# Patient Record
Sex: Female | Born: 1955 | Race: White | Hispanic: No | Marital: Married | Smoking: Former smoker
Health system: Southern US, Community
[De-identification: ages and names within clinical notes are randomized; demographics above are authoritative.]

## PROBLEM LIST (undated history)

## (undated) DIAGNOSIS — Z79890 Hormone replacement therapy (postmenopausal): Secondary | ICD-10-CM

## (undated) DIAGNOSIS — K589 Irritable bowel syndrome without diarrhea: Secondary | ICD-10-CM

## (undated) DIAGNOSIS — M1990 Unspecified osteoarthritis, unspecified site: Secondary | ICD-10-CM

## (undated) HISTORY — DX: Hormone replacement therapy: Z79.890

## (undated) HISTORY — DX: Irritable bowel syndrome, unspecified: K58.9

## (undated) HISTORY — PX: DILATION AND CURETTAGE OF UTERUS: SHX78

## (undated) HISTORY — PX: TUBAL LIGATION: SHX77

---

## 1999-09-05 ENCOUNTER — Encounter: Payer: Self-pay | Admitting: Neurosurgery

## 1999-09-05 ENCOUNTER — Ambulatory Visit (HOSPITAL_COMMUNITY): Admission: RE | Admit: 1999-09-05 | Discharge: 1999-09-05 | Payer: Self-pay | Admitting: Neurosurgery

## 2002-02-18 ENCOUNTER — Encounter: Payer: Self-pay | Admitting: Family Medicine

## 2002-02-18 ENCOUNTER — Ambulatory Visit (HOSPITAL_COMMUNITY): Admission: RE | Admit: 2002-02-18 | Discharge: 2002-02-18 | Payer: Self-pay | Admitting: Family Medicine

## 2004-03-05 ENCOUNTER — Ambulatory Visit (HOSPITAL_COMMUNITY): Admission: RE | Admit: 2004-03-05 | Discharge: 2004-03-05 | Payer: Self-pay | Admitting: Family Medicine

## 2006-12-18 ENCOUNTER — Ambulatory Visit: Payer: Self-pay | Admitting: Urgent Care

## 2007-02-13 ENCOUNTER — Ambulatory Visit: Payer: Self-pay | Admitting: Internal Medicine

## 2007-02-13 ENCOUNTER — Encounter: Payer: Self-pay | Admitting: Internal Medicine

## 2007-02-13 ENCOUNTER — Ambulatory Visit (HOSPITAL_COMMUNITY): Admission: RE | Admit: 2007-02-13 | Discharge: 2007-02-13 | Payer: Self-pay | Admitting: Internal Medicine

## 2007-03-17 ENCOUNTER — Ambulatory Visit: Payer: Self-pay | Admitting: Internal Medicine

## 2008-03-07 ENCOUNTER — Ambulatory Visit (HOSPITAL_COMMUNITY): Admission: RE | Admit: 2008-03-07 | Discharge: 2008-03-07 | Payer: Self-pay | Admitting: Family Medicine

## 2009-07-24 ENCOUNTER — Ambulatory Visit (HOSPITAL_COMMUNITY): Admission: RE | Admit: 2009-07-24 | Discharge: 2009-07-24 | Payer: Self-pay | Admitting: Family Medicine

## 2010-04-30 ENCOUNTER — Encounter: Payer: Self-pay | Admitting: Family Medicine

## 2010-08-21 NOTE — Consult Note (Signed)
NAMEHILARI, Jennifer Anderson                 ACCOUNT NO.:  [id number]   MEDICAL RECORD NO.:  [id number]          PATIENT TYPE:  AMB   LOCATION:  DAY                           FACILITY:  [facility]   PHYSICIAN:  R. Roetta Sessions, M.D. DATE OF BIRTH:  July 22, 1955   DATE OF CONSULTATION:  DATE OF DISCHARGE:                                 CONSULTATION   REASON FOR CONSULTATION:  Chronic diarrhea.   HISTORY OF PRESENT ILLNESS:  [name] is a 55 year old Caucasian  female.  She reports three months ago she began to have constipation and  bloating.  She took a laxative. She says she felt like she had a  stomach virus, she was up all night with cramps and loose stools, this  continued throughout the day and for several days. She began to pass  tissue like material in her stools.  She denies any rectal bleeding or  melena.  She says her abdominal pain has decreased in frequency, but she  is still having significant urgency.  She is still having four to five  loose mucous stools per day.  She describes them as the consistency of  pudding.  She previously had normal soft brown bowel movements.  She did  have loose stools with her periods when she was younger, but since  menopause, she has had what she felt to be regular bowel movements.  She  denies any foreign travel.  She was recently treated with antibiotics  for a pharyngitis. She tells me she had low grade fever when the  diarrhea first started three months ago. She denies any ill contacts.  She denies any NSAID use or other over-the-counter medications, or new  medications.  Her weight has been stable.  She does have horses and a  new puppy at home.   PAST MEDICAL AND SURGICAL HISTORY:  Frequent sinus infections, C-  section, tubal ligation.   CURRENT MEDICATIONS:  1. Prempro once daily.  2. Calcium 600 mg with vitamin D daily.   ALLERGIES:  NO KNOWN DRUG ALLERGIES.   FAMILY HISTORY:  She has a cousin with Crohn's, but no first degree  relatives  with any colorectal carcinoma, liver or chronic GI problems  including inflammatory bowel disease.  Mother deceased at 72 secondary  MI.  Father at age 80 has history of polio and MI.  She has three  brothers with history significant for hypertension.   SOCIAL HISTORY:  [name] is married.  She has two grown healthy  daughters.  She is employed with [employer].  She has a remote  history of smoking for about 20 years, less than a pack a day.  Denies  any alcohol or drug use.   REVIEW OF SYSTEMS:  See HPI.  GU:  She does have urgency but denies any  increased frequency.  GI:  Denies any heartburn, indigestion, dysphagia,  odynophagia, anorexia or early satiety.   PHYSICAL EXAMINATION:  VITAL SIGNS:  Weight 130 pounds, height 67  inches, temp 97.9, blood pressure 120/82, pulse 64.  GENERAL:  [name] is a well-developed, well-nourished  Caucasian female  in no acute distress.  HEENT.  Pupils are clear, anicteric;  conjunctivae pink.  Oropharynx  pink and moist without lesions.  NECK:  Supple without masses or thyromegaly.  CHEST:  Heart regular rate and rhythm.  Normal S1-S2. No murmurs,  clicks, rubs or gallops.  LUNGS:  Lungs clear to auscultation bilaterally.  ABDOMEN:  Positive bowel sounds x4.  No bruits auscultated.  Soft,  nontender and without palpable mass or visceromegaly.  No rebound  tenderness or guarding.  EXTREMITIES:  Without clubbing or edema bilaterally.  SKIN:  Pink, warm, dry without any rash or jaundice.   LABORATORY DATA:  Laboratory studies;  she had a negative C.  Diff and  Giardia Cryptosporidium and stool culture on November 19, 2006.   IMPRESSION:  [name] is a 55 year old Caucasian female with a three  month history of diarrhea, this is a change from her baseline.  She is  also having lower abdominal cramping which is resolved post defecation.  She has also had a change in consistency, although her stools, which are  pudding  like with mucous, but  nonbloody.  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  Given her age, we also need to  rule out colorectal carcinoma.   PLAN:  1. Colonoscopy with possible biopsies for microscopic colitis if      needed by Dr. Jena Gauss in the near future.  I discussed the procedure      including the risks, benefits, including but not limited to      bleeding, infection, perforation, drug reaction.  She agrees with      plan and consent will be obtained.  2. CBC, sed rate, TSH and CMP.  3. Align once daily, I have given her two weeks' worth of samples. She      is instructed to eat yogurt with meals if possible.  4. Bentyl 10 mg p.o. a.c. and h.s. #60 with one refill.   We would like to thank Dr. Gerda Diss for asking to participate in the care  of [name].      Lorenza Burton, N.P.      Jonathon Bellows, M.D.  Electronically Signed    KJ/MEDQ  D:  12/18/2006  T:  12/19/2006  Job:  [id number]

## 2010-08-21 NOTE — Op Note (Signed)
NAMECLARIECE, Jennifer Anderson                 ACCOUNT NO.:  [id number]   MEDICAL RECORD NO.:  [id number]          PATIENT TYPE:  AMB   LOCATION:  DAY                           FACILITY:  [facility]   PHYSICIAN:  R. Roetta Sessions, M.D. DATE OF BIRTH:  11-Apr-1955   DATE OF PROCEDURE:  02/13/2007  DATE OF DISCHARGE:                               OPERATIVE REPORT   PROCEDURE:  Diagnostic ileocolonoscopy with biopsy and stool sampling.   INDICATIONS FOR PROCEDURE:  A 55 year old lady with several-month  history of nonbloody watery diarrhea which began rather acutely.  Stool  studies have been negative.  Colonoscopy is being done to rule out  microscopic colitis and in part for colorectal cancer screening.  She  has never had lower GI tract imaging and no family history of colon  cancer or inflammatory bowel disease.  We saw her back in September,  however, this lady has put off colonoscopy until now.  Please see  documentation in medical records.   DESCRIPTION OF PROCEDURE:  O2 saturation, blood pressure, and pulse on  this patient were monitored throughout the entire procedure.  Conscious  sedation with Versed 6 mg IV, Demerol 125 mg IV in divided doses.  Instrument was Pentax video chip system.   FINDINGS:  Digital rectal examination revealed no abnormalities.   ENDOSCOPIC ASSESSMENT:  Prep was excellent.   Colon:  Colonic mucosa was surveyed from the rectosigmoid junction  through the left transverse and right colon to the appendiceal orifice,  ileocecal valve, and cecum.  These structures were well seen and  photographed from the rectum.  The terminal ileum was intubated 10 cm.  From this level the scope was slowly withdrawn.  All previously  mentioned mucosal surfaces were again seen.  The colonic mucosa was well  seen and appeared entirely normal as did the terminal ileum mucosa.  Segmental biopsies of the transverse, descending, sigmoid, and rectal  mucosa were taken for histologic study.   Also stool residue was  suctioned out for repeat microbiology studies.  The scope was pulled  down to the rectum where the rectal mucosa was examined.  The rectal  vault was very small.  I attempted to retroflex, but was unable to do  so, but for the same reason I was able to see the rectal mucosa very  well and it appeared normal.  Biopsies were taken.  The patient  tolerated the procedure well and was reactivated in endoscopy.   IMPRESSION:  Normal rectum, colon, and terminal ileum, status post  segmental biopsies and stool collection.   RECOMMENDATIONS:  Biopsies will help [place] rule out microscopic colitis.  There is no evidence of inflammatory bowel disease on this examination.  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  We will make further recommendations once  the study results are available for review.      Jonathon Bellows, M.D.  Electronically Signed     RMR/MEDQ  D:  02/13/2007  T:  02/13/2007  Job:  [id number]   cc:   Lorin Picket A. Luking,  MD  Fax: [phone]

## 2010-08-21 NOTE — Assessment & Plan Note (Signed)
NAMEAYNSLEE, MULHALL                  CHART#:  [id number]   DATE:  03/17/2007                       DOB:  [birth year]   CHIEF COMPLAINT:  A 6 month history of diarrhea, followup colonoscopy.   SUBJECTIVE:  The patient is a 55 year old female who developed diarrhea  after a gastroenteritis-type illness back 6 months ago.  She had  symptoms with postprandial diarrhea and significant urgency.  She  underwent diagnostic ileo-colonoscopy with biopsy and stool sampling by  Dr. Jena Gauss on 02/13/2007.  She was found to have a normal rectal colon  and terminal ileum, and multiple random biopsies showed benign mucosa.  She had tried Bentyl, which did seem to help, but does cause some  blurred vision.  She denies any history of glaucoma.  She has continued  to have dark, pudding-like stools 5 to 7 times per day.  Denies any  rectal bleeding.  She has noted some mucus in her stools.  Her weight is  up 3 pounds.  She does have some transient nausea.  Denies any  significant abdominal pain or vomiting.  Denies any fevers or chills.   CURRENT MEDICATIONS:  See the list from 03/17/2007.   ALLERGIES:  No known drug allergies.   OBJECTIVE:  VITAL SIGNS:  Weight 153 pounds, height 67 inches,  temperature 98.4, blood pressure 104/70, and pulse 72.  GENERAL:  The patient is a well-developed, well-nourished Caucasian  female in no acute distress.  who is awake and alert, oriented, pleasant, and cooperative, in no acute  distress.  HEENT:  Pupils equal, round, and reactive to light.  Sclerae clear,  nonicteric.  Conjunctivae pink.  Oropharynx pink and moist without  lesions.  NECK:  Supple without evidence of mass or thyromegaly.  CHEST:  Heart regular rate and rhythm.  Normal S1, S2 without murmurs,  clicks, rubs, or gallops.  LUNGS:  Clear to auscultation bilaterally.  ABDOMEN:  Positive bowel sounds x4.  No bruits auscultated.  Soft.  Nontender, nondistended without palpable mass or hepatosplenomegaly.   No  rebound tenderness or guarding.  EXTREMITIES:  Without clubbing or edema bilaterally.   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   PLAN:  1. Irritable bowel syndrome literature given for her review.  2. Benefiber samples given to begin once daily or fiber supplement of      choice.  3. Symax 0.125 mg AC and at bedtime #60 with 2 refills.  4. Discontinue Bentyl.  5. Begin Align once daily.  6. Office visit in 3 months with Dr. Jena Gauss or as needed.       Lorenza Burton, N.P.  Electronically Signed     R. Roetta Sessions, M.D.  Electronically Signed    KJ/MEDQ  D:  03/17/2007  T:  03/17/2007  Job:  [id number]   cc:   Gwyneth Sprout, NP  Lorin Picket A. Gerda Diss, MD

## 2010-08-24 NOTE — Procedures (Signed)
   Jennifer Anderson, Jennifer Anderson                           ACCOUNT NO.:  [id number]   MEDICAL RECORD NO.:  [id number]                   PATIENT TYPE:  OUT   LOCATION:  [facility]                                 FACILITY:  [facility]   PHYSICIAN:  Edward L. Juanetta Gosling, M.D.             DATE OF BIRTH:  01-28-1956   DATE OF PROCEDURE:  DATE OF DISCHARGE:  02/18/2002                              PULMONARY FUNCTION TEST   PROBLEMS:  Spirometry shows no definitive ventilatory defect but evidence of  air flow obstruction.  The computer has suggested extrathoracic flow  obstruction or tracheal stenosis.  The loop is really not all that typical  of those problems.  If such difficulty is clinically suspected, CT scan of  this area may be of some help.                                               Edward L. Juanetta Gosling, M.D.    ELH/MEDQ  D:  02/20/2002  T:  02/20/2002  Job:  [id number]

## 2011-01-15 LAB — STOOL CULTURE

## 2011-01-15 LAB — FECAL LACTOFERRIN, QUANT: Fecal Lactoferrin: NEGATIVE

## 2011-01-15 LAB — CLOSTRIDIUM DIFFICILE EIA: C difficile Toxins A+B, EIA: NEGATIVE

## 2011-01-15 LAB — OVA AND PARASITE EXAMINATION

## 2012-08-24 ENCOUNTER — Other Ambulatory Visit: Payer: Self-pay | Admitting: Nurse Practitioner

## 2012-08-24 ENCOUNTER — Telehealth: Payer: Self-pay | Admitting: Nurse Practitioner

## 2012-08-24 MED ORDER — CONJ ESTROG-MEDROXYPROGEST ACE 0.625-5 MG PO TABS: 1.0000 | ORAL_TABLET | Freq: Every day | ORAL | Status: DC

## 2012-08-24 NOTE — Telephone Encounter (Signed)
Rx called in. Patient needs PE; last one in 2012; especially with hormones. Sent in 3 months total of hormones to give her time to get in for physical.

## 2012-08-24 NOTE — Telephone Encounter (Signed)
Patient needs a refill of prempro to [facility] in [place]

## 2012-08-24 NOTE — Telephone Encounter (Signed)
Last physical 01/02/11, seen in office 04/09/12 for sick visit

## 2012-08-24 NOTE — Telephone Encounter (Signed)
Patient notified that RX was called in and to schedule PE. Patient verbalized understanding.

## 2012-09-02 ENCOUNTER — Encounter: Payer: Self-pay | Admitting: Nurse Practitioner

## 2012-09-02 ENCOUNTER — Ambulatory Visit (INDEPENDENT_AMBULATORY_CARE_PROVIDER_SITE_OTHER): Payer: BC Managed Care – PPO | Admitting: Nurse Practitioner

## 2012-09-02 VITALS — BP 118/68 | HR 70 | Wt 137.0 lb

## 2012-09-02 DIAGNOSIS — L290 Pruritus ani: Secondary | ICD-10-CM

## 2012-09-02 DIAGNOSIS — R5381 Other malaise: Secondary | ICD-10-CM

## 2012-09-02 DIAGNOSIS — Z0000 Encounter for general adult medical examination without abnormal findings: Secondary | ICD-10-CM

## 2012-09-02 DIAGNOSIS — Z01419 Encounter for gynecological examination (general) (routine) without abnormal findings: Secondary | ICD-10-CM

## 2012-09-02 DIAGNOSIS — R5383 Other fatigue: Secondary | ICD-10-CM

## 2012-09-02 DIAGNOSIS — Z13220 Encounter for screening for lipoid disorders: Secondary | ICD-10-CM

## 2012-09-02 MED ORDER — PREMPRO 0.625-5 MG PO TABS: 1.0000 | ORAL_TABLET | Freq: Every day | ORAL | Status: DC

## 2012-09-02 MED ORDER — CLOTRIMAZOLE-BETAMETHASONE 1-0.05 % EX CREA: TOPICAL_CREAM | Freq: Two times a day (BID) | CUTANEOUS | Status: DC

## 2012-09-03 ENCOUNTER — Encounter: Payer: Self-pay | Admitting: Nurse Practitioner

## 2012-09-03 NOTE — Progress Notes (Signed)
Subjective:    Patient ID: Jennifer Anderson, female    DOB: Aug 10, 1955, 57 y.o.   MRN: [id number]  HPI presents for wellness checkup. Married, same sexual partner. No vaginal bleeding her menses. No pelvic pain. Takes daily calcium and vitamin D. Gets regular eye exams. Regular dental exams. Had a recent mammogram at [facility] which she states was normal. Mild fatigue. Has a history of chronic external anal itching. Has tried multiple therapies including Vermox with no improvement. No hemorrhoids. Just intense rectal itching at times.    Review of Systems  Constitutional: Negative for activity change, appetite change and fatigue.  HENT: Negative for dental problem.   Eyes: Negative for visual disturbance.  Respiratory: Negative for chest tightness and shortness of breath.   Cardiovascular: Negative for chest pain.  Gastrointestinal: Negative for abdominal pain, diarrhea, constipation, blood in stool, abdominal distention, anal bleeding and rectal pain.  Genitourinary: Negative for urgency, vaginal bleeding, vaginal discharge, difficulty urinating, menstrual problem and pelvic pain.       Objective:   Physical Exam  Constitutional: She is oriented to person, place, and time. She appears well-developed. No distress.  HENT:  Right Ear: External ear normal.  Left Ear: External ear normal.  Mouth/Throat: Oropharynx is clear and moist.  Neck: Normal range of motion. Neck supple. No tracheal deviation present. No thyromegaly present.  Cardiovascular: Normal rate, regular rhythm and normal heart sounds.  Exam reveals no gallop.   No murmur heard. Pulmonary/Chest: Effort normal and breath sounds normal.  Abdominal: Soft. She exhibits no distension. There is no tenderness.  Genitourinary: Vagina normal and uterus normal. No vaginal discharge found.  Musculoskeletal: She exhibits no edema.  Lymphadenopathy:    She has no cervical adenopathy.  Neurological: She is alert and oriented to  person, place, and time.  Skin: Skin is warm and dry. No rash noted.  Psychiatric: She has a normal mood and affect. Her behavior is normal.   Breast exam: Mild fine nodularity, no dominant masses. Exam no adenopathy. External GU normal limit. Bimanual exam uterus and adnexa normal limit, no obvious masses. No tenderness. Rectal area no rash noted. One small skin tag from previous hemorrhoids noted, nontender. Rectal exam normal limit, no stool for Hemoccult. Skin in general particularly on sun exposed areas areas moderate sun damage.       Assessment & Plan:  Well woman exam  Rectal itching  Fatigue - Plan: Basic metabolic panel, Hepatic function panel, TSH, Vitamin D 25 hydroxy, Basic metabolic panel, Hepatic function panel, TSH, Vitamin D 25 hydroxy  Screening, lipid - Plan: Lipid panel, Lipid panel  Given information for Dr. Orvan Falconer in [place], recommend skin cancer screening. Meds ordered this encounter  Medications  . OVER THE COUNTER MEDICATION    Sig: Calcium with Vit D one every day  . PREMPRO 0.625-5 MG per tablet    Sig: Take 1 tablet by mouth daily.    Dispense:  30 tablet    Refill:  11    Please dispense name brand Prempro    Order Specific Question:  Supervising Provider    Answer:  Merlyn Albert [[id number]]  . clotrimazole-betamethasone (LOTRISONE) cream    Sig: Apply topically 2 (two) times daily. Use up to 2 weeks.  Call back if symptoms persist.    Dispense:  30 g    Refill:  0    Order Specific Question:  Supervising Provider    Answer:  Riccardo Dubin   Patient wishes  to continue hormone therapy. Discussed at length risks factors including cancer and blood clots. Has tried to wean off in the past, has extreme symptoms. No relief with natural supplements. Trial of Lotrisone cream to the rectal area, call back if no improvement. Possibly related to nerve endings. Next physical in one year.

## 2012-10-01 ENCOUNTER — Encounter: Payer: Self-pay | Admitting: Nurse Practitioner

## 2012-10-08 ENCOUNTER — Telehealth: Payer: Self-pay | Admitting: Family Medicine

## 2012-10-08 ENCOUNTER — Encounter: Payer: Self-pay | Admitting: Nurse Practitioner

## 2012-10-08 LAB — HEPATIC FUNCTION PANEL
AST: 15 U/L (ref 0–37)
Albumin: 4.2 g/dL (ref 3.5–5.2)
Alkaline Phosphatase: 63 U/L (ref 39–117)
Indirect Bilirubin: 0.4 mg/dL (ref 0.0–0.9)
Total Bilirubin: 0.6 mg/dL (ref 0.3–1.2)

## 2012-10-08 LAB — BASIC METABOLIC PANEL
Calcium: 9.1 mg/dL (ref 8.4–10.5)
Glucose, Bld: 78 mg/dL (ref 70–99)
Potassium: 4.6 mEq/L (ref 3.5–5.3)
Sodium: 144 mEq/L (ref 135–145)

## 2012-10-08 LAB — LIPID PANEL
HDL: 65 mg/dL (ref >39)
LDL Cholesterol: 81 mg/dL (ref 0–99)
Total CHOL/HDL Ratio: 2.4 Ratio
VLDL: 10 mg/dL (ref 0–40)

## 2012-10-08 NOTE — Telephone Encounter (Signed)
Enc Date 10/08/12 - printed & mailed 10/12/12

## 2012-10-12 ENCOUNTER — Encounter: Payer: Self-pay | Admitting: Nurse Practitioner

## 2012-11-10 ENCOUNTER — Telehealth: Payer: Self-pay | Admitting: Family Medicine

## 2012-11-10 NOTE — Telephone Encounter (Signed)
If is a small localized rash, just HC 1% cream for itching.  Call if unexplained fever, headache, other rashes including bulls-eye rash; it may take a few weeks for a localized reaction to the bite to resolve.

## 2012-11-10 NOTE — Telephone Encounter (Signed)
Left message to return call 

## 2012-11-10 NOTE — Telephone Encounter (Signed)
No other sx

## 2012-11-10 NOTE — Telephone Encounter (Signed)
Discussed with patient. Patient verbalized understanding. 

## 2012-11-10 NOTE — Telephone Encounter (Signed)
Patient got a tick bite day before yesterday, but now has a rash around it. She would like to know if she should take some precautionary measures.

## 2013-02-11 ENCOUNTER — Other Ambulatory Visit: Payer: Self-pay

## 2013-02-28 ENCOUNTER — Encounter: Payer: Self-pay | Admitting: Family Medicine

## 2013-09-02 ENCOUNTER — Other Ambulatory Visit: Payer: Self-pay | Admitting: Nurse Practitioner

## 2013-10-11 ENCOUNTER — Other Ambulatory Visit: Payer: Self-pay | Admitting: Family Medicine

## 2013-10-27 ENCOUNTER — Ambulatory Visit (INDEPENDENT_AMBULATORY_CARE_PROVIDER_SITE_OTHER): Payer: PRIVATE HEALTH INSURANCE | Admitting: Nurse Practitioner

## 2013-10-27 ENCOUNTER — Encounter: Payer: Self-pay | Admitting: Nurse Practitioner

## 2013-10-27 VITALS — BP 116/76 | Resp 18 | Ht 67.0 in | Wt 132.0 lb

## 2013-10-27 DIAGNOSIS — M25551 Pain in right hip: Secondary | ICD-10-CM

## 2013-10-27 DIAGNOSIS — M25559 Pain in unspecified hip: Secondary | ICD-10-CM

## 2013-10-27 DIAGNOSIS — R252 Cramp and spasm: Secondary | ICD-10-CM

## 2013-10-27 MED ORDER — PREMPRO 0.625-5 MG PO TABS: ORAL_TABLET | ORAL | Status: DC

## 2013-10-31 ENCOUNTER — Encounter: Payer: Self-pay | Admitting: Nurse Practitioner

## 2013-10-31 NOTE — Progress Notes (Signed)
   Subjective:    Patient ID: Jennifer Anderson, female    DOB: 05/22/1955, 58 y.o.   MRN: [id number]  HPI  presents for complaints of right hip pain off-and-on for the past 2 years. Will sometimes wake her up at nighttime. No history of injury. Rides horses frequently. No back pain. Also localized cramping in the left lateral lower leg at times. History of significant injury to her left ankle in the past.    Review of Systems  Musculoskeletal:       Right hip pain   cramping left lateral leg.     Objective:   Physical Exam   NAD. Alert, oriented. No lumbar spinal or paraspinal tenderness. Good ROM of the right hip with minimal tenderness. Minimal tenderness with palpation of the right hip joint. Left lower leg no edema. Mild supination of the left foot with standing and gait. Also mild changes in the left ankle from previous injury.        Assessment & Plan:  Right hip pain  Muscle cramps secondary to change in gait related to previous ankle injury  Neoprene ankle support. Patient given form for x-ray of the right hip along with her screening mammogram. Recommend scheduling a wellness physical within the next few months. Meds ordered this encounter  Medications  . PREMPRO 0.625-5 MG per tablet    Sig: take 1 tablet by mouth once daily    Dispense:  30 tablet    Refill:  5    Order Specific Question:  Supervising Provider    Answer:  Mikey Kirschner [[id number]]   Discussed risks associated with continued HRT use including cancer risk and blood clots. Patient understands risk but wishes to continue at this time.

## 2013-11-16 ENCOUNTER — Telehealth: Payer: Self-pay | Admitting: Family Medicine

## 2013-11-16 DIAGNOSIS — M25551 Pain in right hip: Secondary | ICD-10-CM

## 2013-11-16 NOTE — Telephone Encounter (Signed)
Patient stated she went to [facility] and had this done. Faxed over a request to [place] records for results of mammo and xray.

## 2013-11-16 NOTE — Telephone Encounter (Signed)
Patient calling to get results to the xray of her hip.

## 2013-11-16 NOTE — Telephone Encounter (Signed)
Patient was given a form for mammogram and xray of right hip. I think she was going to [place] or somewhere outside of [place].

## 2013-11-16 NOTE — Telephone Encounter (Signed)
Pt see on 7/22. I do not see where an xray was ordered?

## 2013-11-17 NOTE — Telephone Encounter (Signed)
Results put on chart in Carolyn's basket.

## 2013-11-18 NOTE — Telephone Encounter (Signed)
See notes on forms.

## 2013-11-18 NOTE — Telephone Encounter (Signed)
Pt notified and verbalized understanding of xray results. I put in referral for ortho d/t persistent pain.

## 2013-12-07 ENCOUNTER — Telehealth: Payer: Self-pay | Admitting: Family Medicine

## 2013-12-07 NOTE — Telephone Encounter (Signed)
Checking on referral for hip pain.

## 2013-12-09 ENCOUNTER — Encounter: Payer: Self-pay | Admitting: Nurse Practitioner

## 2013-12-09 NOTE — Telephone Encounter (Signed)
Pt states [place] office of [facility] is fine, referral sent, await appt info

## 2013-12-09 NOTE — Telephone Encounter (Signed)
[facility] - ? Preference on ortho

## 2013-12-14 ENCOUNTER — Encounter: Payer: Self-pay | Admitting: Family Medicine

## 2014-02-14 ENCOUNTER — Ambulatory Visit (INDEPENDENT_AMBULATORY_CARE_PROVIDER_SITE_OTHER): Payer: PRIVATE HEALTH INSURANCE | Admitting: Nurse Practitioner

## 2014-02-14 ENCOUNTER — Encounter: Payer: Self-pay | Admitting: Nurse Practitioner

## 2014-02-14 VITALS — BP 132/80 | HR 80 | Ht 66.25 in | Wt 133.4 lb

## 2014-02-14 DIAGNOSIS — Z01419 Encounter for gynecological examination (general) (routine) without abnormal findings: Secondary | ICD-10-CM

## 2014-02-14 DIAGNOSIS — Z124 Encounter for screening for malignant neoplasm of cervix: Secondary | ICD-10-CM

## 2014-02-14 DIAGNOSIS — Z1151 Encounter for screening for human papillomavirus (HPV): Secondary | ICD-10-CM

## 2014-02-14 DIAGNOSIS — R5383 Other fatigue: Secondary | ICD-10-CM

## 2014-02-14 DIAGNOSIS — Z139 Encounter for screening, unspecified: Secondary | ICD-10-CM

## 2014-02-14 DIAGNOSIS — Z0000 Encounter for general adult medical examination without abnormal findings: Secondary | ICD-10-CM

## 2014-02-14 MED ORDER — PREMPRO 0.625-5 MG PO TABS: ORAL_TABLET | ORAL | Status: DC

## 2014-02-16 LAB — PAP IG W/ RFLX HPV ASCU

## 2014-02-17 ENCOUNTER — Encounter: Payer: Self-pay | Admitting: Nurse Practitioner

## 2014-02-17 LAB — HUMAN PAPILLOMAVIRUS, HIGH RISK: HPV DNA High Risk: NOT DETECTED

## 2014-02-17 NOTE — Progress Notes (Signed)
   Subjective:    Patient ID: Jennifer Anderson, female    DOB: 1955-05-08, 58 y.o.   MRN: [id number]  HPI presents for her wellness exam. Married, same sexual partner. No vaginal bleeding. Very active lifestyle. Eats healthy. Regular vision and dental exams. Unsure about varicella status. Note her hip pain is much improved.    Review of Systems  Constitutional: Negative for fever, activity change, appetite change and fatigue.  HENT: Negative for dental problem, sinus pressure and sore throat.   Respiratory: Negative for cough, chest tightness, shortness of breath and wheezing.   Cardiovascular: Negative for chest pain.  Gastrointestinal: Negative for nausea, vomiting, abdominal pain, diarrhea, constipation, blood in stool and abdominal distention.  Genitourinary: Negative for dysuria, urgency, frequency, vaginal bleeding, vaginal discharge, enuresis, difficulty urinating, genital sores and pelvic pain.       Objective:   Physical Exam  Constitutional: She is oriented to person, place, and time. She appears well-developed. No distress.  HENT:  Right Ear: External ear normal.  Left Ear: External ear normal.  Mouth/Throat: Oropharynx is clear and moist.  Neck: Normal range of motion. Neck supple. No tracheal deviation present. No thyromegaly present.  Cardiovascular: Normal rate, regular rhythm and normal heart sounds.  Exam reveals no gallop.   No murmur heard. Pulmonary/Chest: Effort normal and breath sounds normal.  Abdominal: Soft. She exhibits no distension. There is no tenderness.  Genitourinary: Vagina normal and uterus normal. No vaginal discharge found.  External GU no rashes or lesions noted. Vagina slightly pale, no discharge. Cervix normal limit in appearance, no CMT. Bimanual exam no tenderness or masses; ovaries nonpalpable. Rectal exam no masses, no stool for Hemoccult.  Musculoskeletal: She exhibits no edema.  Lymphadenopathy:    She has no cervical adenopathy.    Neurological: She is alert and oriented to person, place, and time.  Skin: Skin is warm and dry. No rash noted.  Psychiatric: She has a normal mood and affect. Her behavior is normal.  Vitals reviewed. Breast exam: Slightly dense tissue, no masses. Axillae no adenopathy.        Assessment & Plan:  Well woman exam  Screening for cervical cancer - Plan: Pap IG w/ reflex to HPV when ASC-U  Screening - Plan: POC Hemoccult Bld/Stl (3-Cd Home Screen), Lipid panel, Hepatic function panel, Basic metabolic panel, Varicella zoster antibody, IgG, CANCELED: Lipid panel, CANCELED: Hepatic function panel, CANCELED: Basic metabolic panel  Other fatigue - Plan: Hepatic function panel, Basic metabolic panel, TSH, CANCELED: Hepatic function panel, CANCELED: Basic metabolic panel, CANCELED: TSH  Obtain varicella titer. May need Zostavax based on results. Encouraged continued activity and healthy diet. Also daily vitamin D and calcium supplementation. Would like to continue Prempro for now, understands risk associated with hormone use. Return in about 1 year (around 02/15/2015).

## 2015-02-17 ENCOUNTER — Other Ambulatory Visit: Payer: Self-pay | Admitting: Nurse Practitioner

## 2015-03-14 ENCOUNTER — Ambulatory Visit (INDEPENDENT_AMBULATORY_CARE_PROVIDER_SITE_OTHER): Payer: PRIVATE HEALTH INSURANCE | Admitting: Family Medicine

## 2015-03-14 VITALS — BP 128/76 | Temp 98.4°F | Ht 66.5 in | Wt 143.0 lb

## 2015-03-14 DIAGNOSIS — B349 Viral infection, unspecified: Secondary | ICD-10-CM

## 2015-03-14 DIAGNOSIS — J028 Acute pharyngitis due to other specified organisms: Secondary | ICD-10-CM

## 2015-03-14 LAB — POCT RAPID STREP A (OFFICE): Rapid Strep A Screen: NEGATIVE

## 2015-03-14 NOTE — Progress Notes (Signed)
   Subjective:    Patient ID: Jennifer Anderson, female    DOB: 1956-02-06, 59 y.o.   MRN: [id number]  Cough Episode onset: 3 days ago. Associated symptoms include nasal congestion, rhinorrhea and a sore throat. Pertinent negatives include no chest pain, ear pain, fever, shortness of breath or wheezing. Associated symptoms comments: abd pain, body aches. She has tried nothing for the symptoms.  significant sore throat over the past few days slight runny nose and cough no body aches fever chills  PMH benign  Review of Systems  Constitutional: Negative for fever and activity change.  HENT: Positive for congestion, rhinorrhea and sore throat. Negative for ear pain.   Eyes: Negative for discharge.  Respiratory: Positive for cough. Negative for shortness of breath and wheezing.   Cardiovascular: Negative for chest pain.       Objective:   Physical Exam  Constitutional: She appears well-developed.  HENT:  Head: Normocephalic.  Nose: Nose normal.  Mouth/Throat: Oropharynx is clear and moist. No oropharyngeal exudate.  Neck: Neck supple.  Cardiovascular: Normal rate and normal heart sounds.   No murmur heard. Pulmonary/Chest: Effort normal and breath sounds normal. She has no wheezes.  Lymphadenopathy:    She has no cervical adenopathy.  Skin: Skin is warm and dry.  Nursing note and vitals reviewed.         Assessment & Plan:  Severe sore throat no sign of strep strep test negative Viral like symptoms of runny nose slight cough Hold on any antibiotics currently, recommend follow-up if progressive troubles, if not significantly improved over the next 5-7 days or if this seems to roll into a sinus infection then she can call we may need to prescribe antibiotics

## 2015-03-15 ENCOUNTER — Telehealth: Payer: Self-pay | Admitting: Family Medicine

## 2015-03-15 MED ORDER — CEFPROZIL 500 MG PO TABS: ORAL_TABLET | ORAL | Status: DC

## 2015-03-15 NOTE — Telephone Encounter (Signed)
Yesterday I discussed the case with the patient I felt this was viral at that time there was an agreement to give this some time. Given what has transpired Cefzil 500 mg twice a day 10 days, follow-up of problems

## 2015-03-15 NOTE — Telephone Encounter (Signed)
Called patient and informed her per Dr.Scott Luking- Cefzil 500 mg was called into pharmacy and patient is to take 1 tablet twice a day for 10 days and to follow up if any problems. Patient verbalized understanding.

## 2015-03-15 NOTE — Telephone Encounter (Signed)
Spoke with patient to get more detailed information on symptoms. Patient has c/o of sore throat with difficulty swallowing, ear pain, and sinus pressure. Patient stated she feels worst today and wants to know if we can call something in for her.

## 2015-03-15 NOTE — Telephone Encounter (Signed)
Pt called stating that she is not any better and would like for an antibiotic to be called in. Pt was seen yesterday.    [facility] [place].

## 2015-05-18 ENCOUNTER — Other Ambulatory Visit: Payer: Self-pay | Admitting: Nurse Practitioner

## 2015-05-18 NOTE — Telephone Encounter (Signed)
Patient last seen for Physical November of 2015 may we refill?

## 2015-05-19 MED ORDER — PREMPRO 0.625-5 MG PO TABS
1.0000 | ORAL_TABLET | Freq: Every day | ORAL | Status: DC
Start: 2015-05-19 — End: 2015-06-26

## 2015-05-19 NOTE — Telephone Encounter (Signed)
Will refill x 1; must be seen for physical before further refills.

## 2015-06-25 ENCOUNTER — Other Ambulatory Visit: Payer: Self-pay | Admitting: Nurse Practitioner

## 2015-07-12 ENCOUNTER — Ambulatory Visit (INDEPENDENT_AMBULATORY_CARE_PROVIDER_SITE_OTHER): Payer: PRIVATE HEALTH INSURANCE | Admitting: Nurse Practitioner

## 2015-07-12 ENCOUNTER — Encounter: Payer: Self-pay | Admitting: Nurse Practitioner

## 2015-07-12 VITALS — BP 128/76 | Ht 67.0 in | Wt 140.0 lb

## 2015-07-12 DIAGNOSIS — N951 Menopausal and female climacteric states: Secondary | ICD-10-CM | POA: Insufficient documentation

## 2015-07-12 DIAGNOSIS — Z780 Asymptomatic menopausal state: Secondary | ICD-10-CM

## 2015-07-12 DIAGNOSIS — R238 Other skin changes: Secondary | ICD-10-CM

## 2015-07-12 DIAGNOSIS — R233 Spontaneous ecchymoses: Secondary | ICD-10-CM

## 2015-07-12 MED ORDER — PREMPRO 0.625-5 MG PO TABS: 1.0000 | ORAL_TABLET | Freq: Every day | ORAL | Status: DC

## 2015-07-12 NOTE — Progress Notes (Signed)
Subjective:  Presents for recheck on HRT. No bleeding or pelvic pain. Has tried to stop in the past but had terrible hot flashes and vaginal dryness. Active. Healthy diet. Nonsmoker. No family history of breast cancer. Also, has noticed easy bruising on her arms. No bleeding from the gums, nosebleeds or blood in stool. Mainly occurs on the arms.   Objective:   BP 128/76 mmHg  Ht 5\' 7"  (1.702 m)  Wt 140 lb (63.504 kg)  BMI 21.92 kg/m2 NAD. Alert, oriented. Lungs clear. Heart RRR. A few discrete superficial ecchymoses noted on the arms.   Assessment:  Problem List Items Addressed This Visit      Other   Menopause - Primary    Other Visit Diagnoses    Easy bruisability        Relevant Orders    CBC with Differential/Platelet      Plan:  Meds ordered this encounter  Medications  . PREMPRO 0.625-5 MG tablet    Sig: Take 1 tablet by mouth daily.    Dispense:  90 tablet    Refill:  3    Order Specific Question:  Supervising Provider    Answer:  Mikey Kirschner [[id number]]   Bruising most likely normal part of aging and decrease in subcu tissue. Will obtain CBC as a precaution. Call back if worsens. Again reviewed potential risks associated with HRT use. Will continue for now.   Return for physical in November.

## 2015-07-13 LAB — CBC WITH DIFFERENTIAL/PLATELET
BASOS ABS: 0 10*3/uL (ref 0.0–0.2)
Basos: 1 %
EOS (ABSOLUTE): 0.1 10*3/uL (ref 0.0–0.4)
Eos: 2 %
Hematocrit: 40.8 % (ref 34.0–46.6)
Hemoglobin: 13.5 g/dL (ref 11.1–15.9)
Immature Grans (Abs): 0 10*3/uL (ref 0.0–0.1)
Immature Granulocytes: 0 %
LYMPHS ABS: 1.9 10*3/uL (ref 0.7–3.1)
Lymphs: 32 %
MCH: 30.2 pg (ref 26.6–33.0)
MCHC: 33.1 g/dL (ref 31.5–35.7)
MCV: 91 fL (ref 79–97)
MONOS ABS: 0.3 10*3/uL (ref 0.1–0.9)
Monocytes: 6 %
Neutrophils Absolute: 3.4 10*3/uL (ref 1.4–7.0)
Neutrophils: 59 %
Platelets: 256 10*3/uL (ref 150–379)
RBC: 4.47 x10E6/uL (ref 3.77–5.28)
RDW: 12.7 % (ref 12.3–15.4)
WBC: 5.7 10*3/uL (ref 3.4–10.8)

## 2016-01-01 DIAGNOSIS — M5136 Other intervertebral disc degeneration, lumbar region: Secondary | ICD-10-CM | POA: Insufficient documentation

## 2016-01-01 DIAGNOSIS — M419 Scoliosis, unspecified: Secondary | ICD-10-CM | POA: Insufficient documentation

## 2017-06-17 ENCOUNTER — Other Ambulatory Visit (HOSPITAL_COMMUNITY): Payer: Self-pay | Admitting: Adult Health Nurse Practitioner

## 2017-06-17 DIAGNOSIS — Z1231 Encounter for screening mammogram for malignant neoplasm of breast: Secondary | ICD-10-CM

## 2017-06-26 ENCOUNTER — Ambulatory Visit (HOSPITAL_COMMUNITY)
Admission: RE | Admit: 2017-06-26 | Discharge: 2017-06-26 | Disposition: A | Discharge status: Home / Self Care | Payer: BLUE CROSS/BLUE SHIELD | Source: Ambulatory Visit | Attending: Adult Health Nurse Practitioner | Admitting: Adult Health Nurse Practitioner

## 2017-06-26 DIAGNOSIS — Z1231 Encounter for screening mammogram for malignant neoplasm of breast: Secondary | ICD-10-CM | POA: Diagnosis not present

## 2017-08-15 ENCOUNTER — Encounter: Payer: Self-pay | Admitting: Internal Medicine

## 2017-09-22 ENCOUNTER — Ambulatory Visit: Payer: BLUE CROSS/BLUE SHIELD

## 2018-04-08 DIAGNOSIS — R931 Abnormal findings on diagnostic imaging of heart and coronary circulation: Secondary | ICD-10-CM

## 2018-04-08 HISTORY — DX: Abnormal findings on diagnostic imaging of heart and coronary circulation: R93.1

## 2018-10-07 ENCOUNTER — Other Ambulatory Visit: Payer: Self-pay

## 2018-10-07 ENCOUNTER — Other Ambulatory Visit: Payer: BLUE CROSS/BLUE SHIELD

## 2018-10-07 DIAGNOSIS — Z20822 Contact with and (suspected) exposure to covid-19: Secondary | ICD-10-CM

## 2018-10-14 LAB — NOVEL CORONAVIRUS, NAA: SARS-CoV-2, NAA: NOT DETECTED

## 2018-11-23 DIAGNOSIS — Z8249 Family history of ischemic heart disease and other diseases of the circulatory system: Secondary | ICD-10-CM | POA: Insufficient documentation

## 2018-11-30 ENCOUNTER — Other Ambulatory Visit: Payer: Self-pay | Admitting: *Deleted

## 2018-11-30 DIAGNOSIS — Z20822 Contact with and (suspected) exposure to covid-19: Secondary | ICD-10-CM

## 2018-12-01 LAB — NOVEL CORONAVIRUS, NAA: SARS-CoV-2, NAA: NOT DETECTED

## 2018-12-31 ENCOUNTER — Other Ambulatory Visit: Payer: Self-pay | Admitting: Physician Assistant

## 2018-12-31 DIAGNOSIS — R918 Other nonspecific abnormal finding of lung field: Secondary | ICD-10-CM

## 2019-02-24 ENCOUNTER — Other Ambulatory Visit: Payer: Self-pay

## 2019-02-24 DIAGNOSIS — Z20822 Contact with and (suspected) exposure to covid-19: Secondary | ICD-10-CM

## 2019-02-25 LAB — NOVEL CORONAVIRUS, NAA: SARS-CoV-2, NAA: NOT DETECTED

## 2019-06-09 ENCOUNTER — Other Ambulatory Visit: Payer: Self-pay

## 2019-06-09 ENCOUNTER — Ambulatory Visit
Admission: RE | Admit: 2019-06-09 | Discharge: 2019-06-09 | Disposition: A | Discharge status: Home / Self Care | Payer: 59 | Source: Ambulatory Visit | Attending: Physician Assistant | Admitting: Physician Assistant

## 2019-06-09 DIAGNOSIS — R918 Other nonspecific abnormal finding of lung field: Secondary | ICD-10-CM

## 2019-06-15 ENCOUNTER — Other Ambulatory Visit: Payer: Self-pay | Admitting: Physician Assistant

## 2019-06-15 DIAGNOSIS — R918 Other nonspecific abnormal finding of lung field: Secondary | ICD-10-CM

## 2020-09-26 ENCOUNTER — Other Ambulatory Visit (HOSPITAL_COMMUNITY): Payer: Self-pay | Admitting: Physician Assistant

## 2020-09-26 DIAGNOSIS — Z1231 Encounter for screening mammogram for malignant neoplasm of breast: Secondary | ICD-10-CM

## 2020-10-20 ENCOUNTER — Ambulatory Visit (HOSPITAL_COMMUNITY)
Admission: RE | Admit: 2020-10-20 | Discharge: 2020-10-20 | Disposition: A | Discharge status: Home / Self Care | Payer: 59 | Source: Ambulatory Visit | Attending: Physician Assistant | Admitting: Physician Assistant

## 2020-10-20 ENCOUNTER — Other Ambulatory Visit: Payer: Self-pay

## 2020-10-20 DIAGNOSIS — Z1231 Encounter for screening mammogram for malignant neoplasm of breast: Secondary | ICD-10-CM

## 2021-09-11 IMAGING — CT CT CHEST W/O CM
1 series · 15 of 34 positions shown, 19 images · non-contrast
Comparison: None available at the time of dictation.

CLINICAL DATA: Lung nodules.

EXAM:
CT CHEST WITHOUT CONTRAST
TECHNIQUE: Multidetector CT imaging of the chest was performed following the
standard protocol without IV contrast.

[Series 2: chest w/(date) · axial · 0.65mm/px · z∈[-288,-12]mm · 15 of 164 slices shown, 19 images]
[im 13/164  mediastinal]
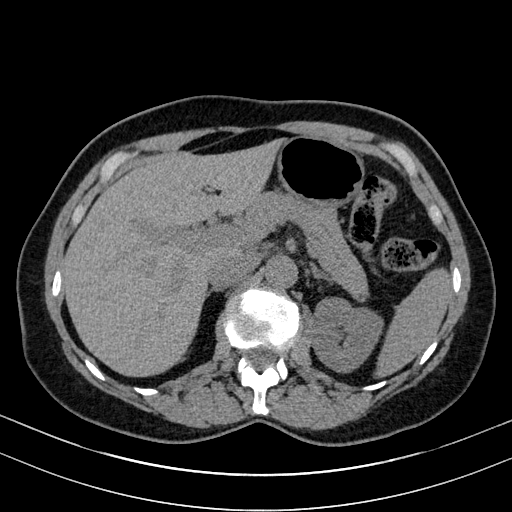
[im 13/164  lung]
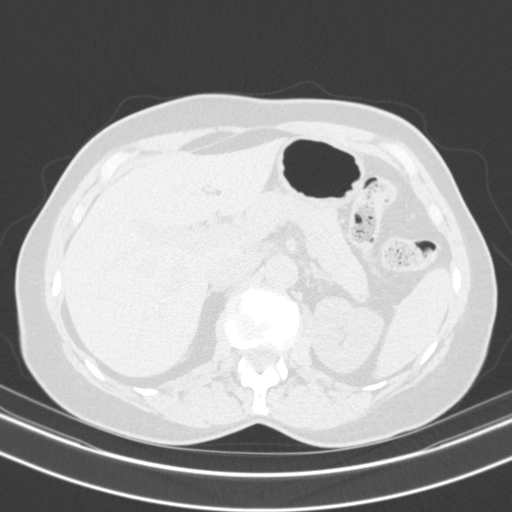
[im 25/164  lung]
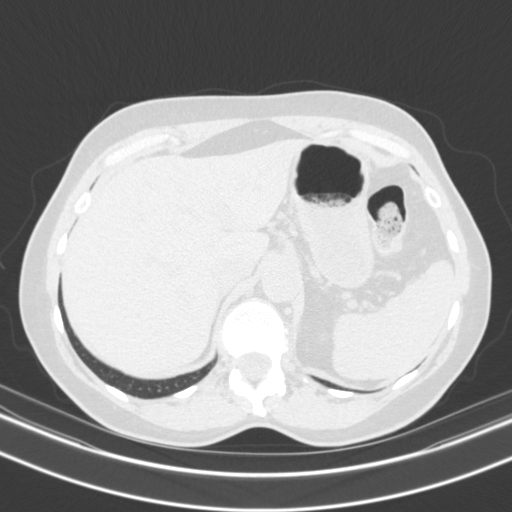
[im 33/164  lung]
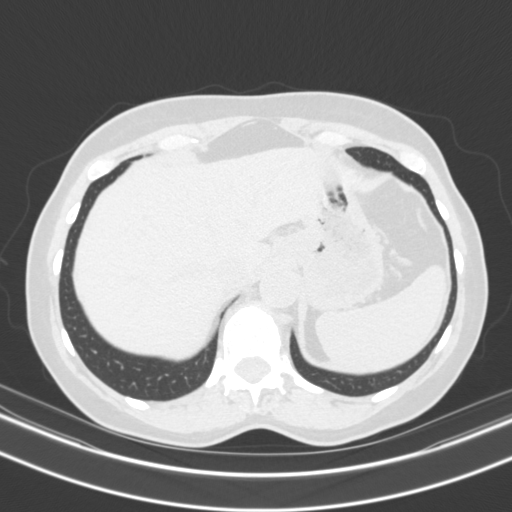
[im 43/164  lung]
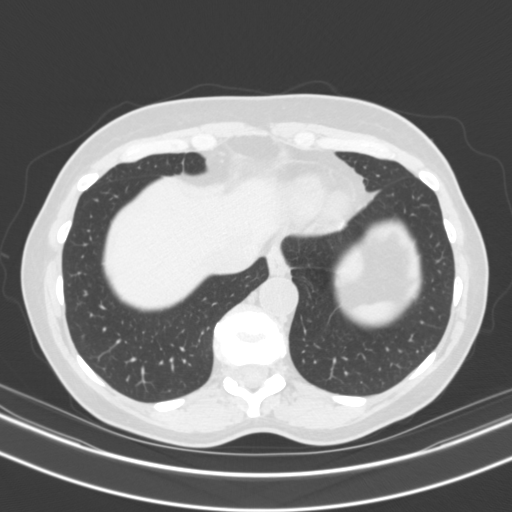
[im 55/164  mediastinal]
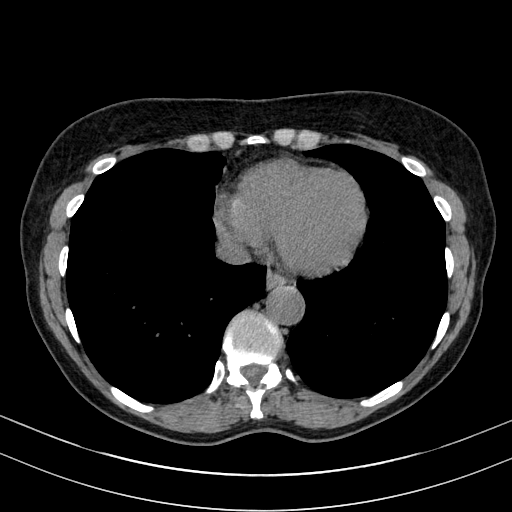
[im 55/164  lung]
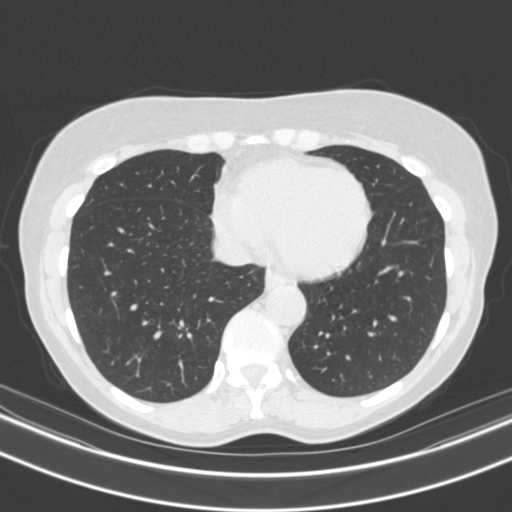
[im 66/164  lung]
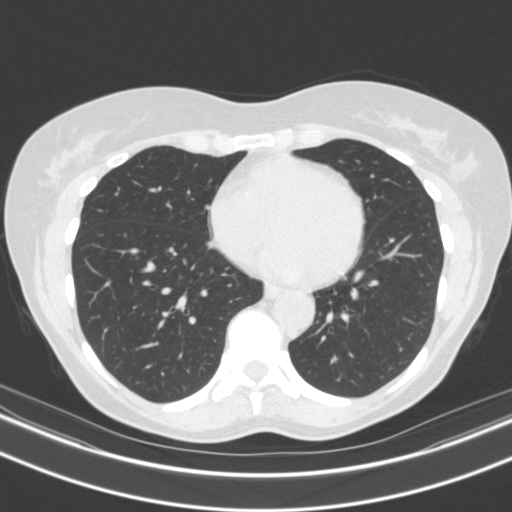
[im 73/164  lung]
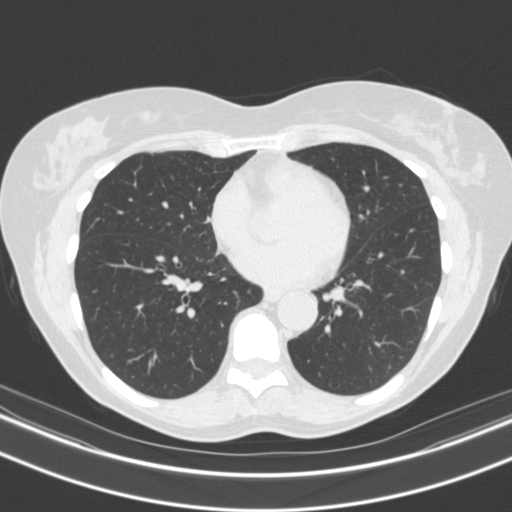
[im 85/164  lung]
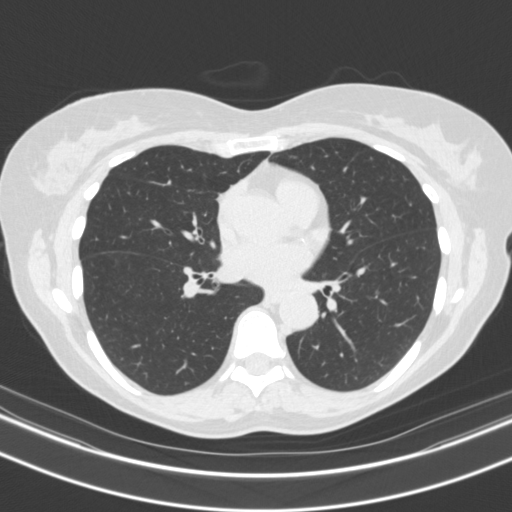
[im 91/164  mediastinal]
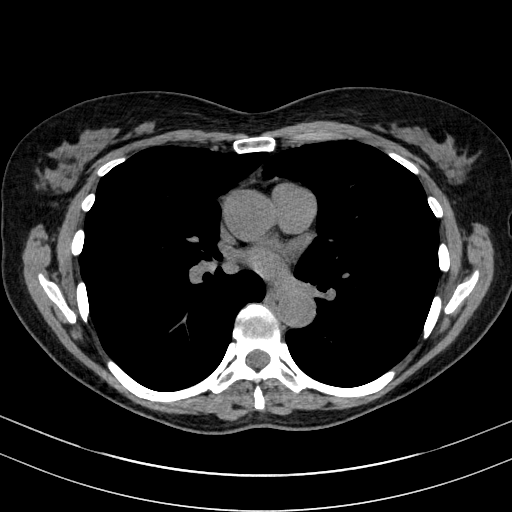
[im 91/164  lung]
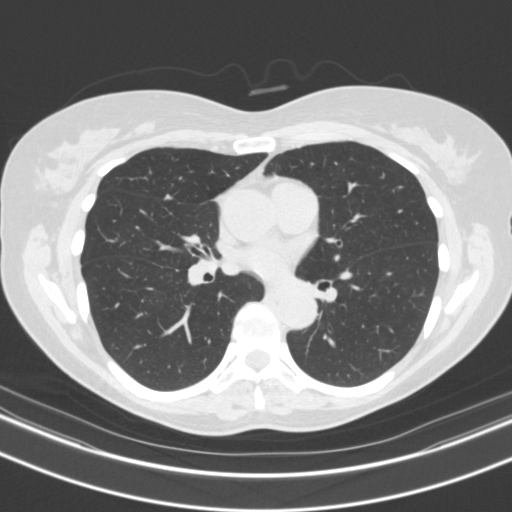
[im 98/164  lung]
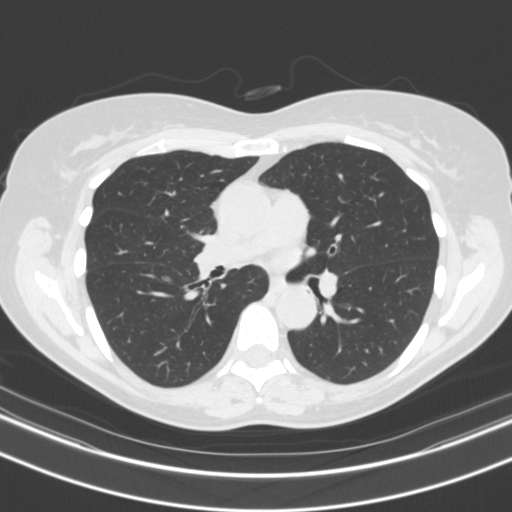
[im 109/164  lung]
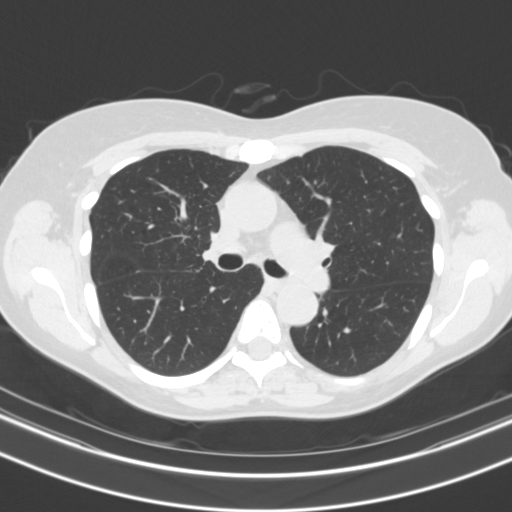
[im 121/164  lung]
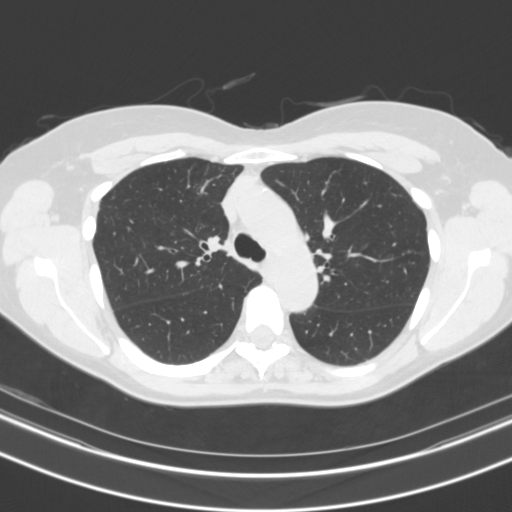
[im 131/164  mediastinal]
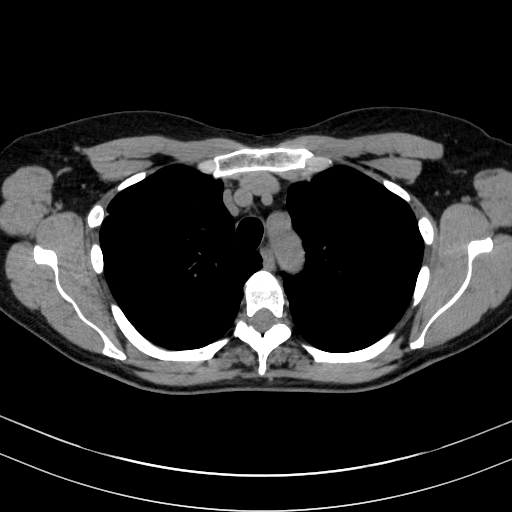
[im 131/164  lung]
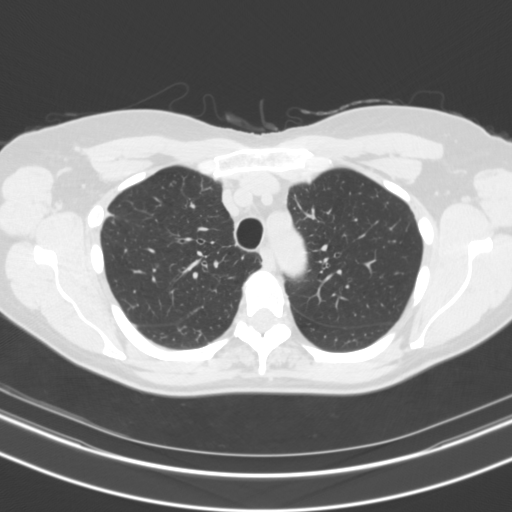
[im 139/164  lung]
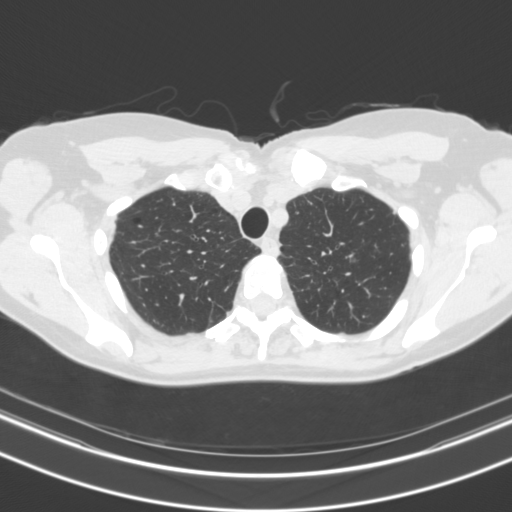
[im 151/164  lung]
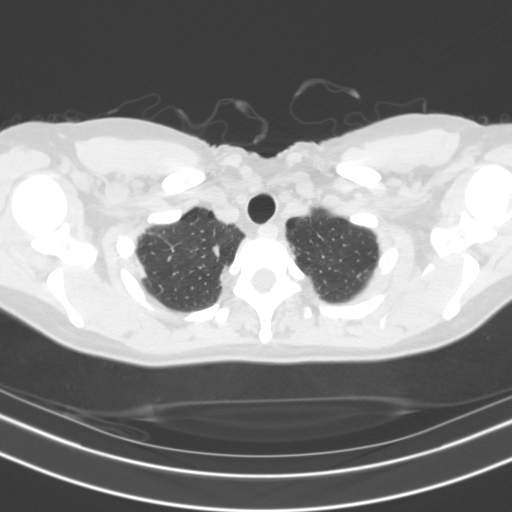

[15 of 34 positions shown; findings below may reference images not displayed]

FINDINGS: Cardiovascular: Atherosclerotic calcification of the aorta and
coronary arteries. Heart size normal. No pericardial effusion.

Mediastinum/Nodes: No pathologically enlarged mediastinal or
axillary lymph nodes. Hilar regions are difficult to evaluate
without IV contrast but appear grossly unremarkable. Esophagus is
grossly unremarkable.

Lungs/Pleura: Biapical pleuroparenchymal scarring. A few scattered
solid pulmonary nodules measure up to 4 mm in the medial segment
right middle lobe, near the minor fissure (3/66). Ground-glass
nodules in the left upper lobe measure up to 5 mm ([DATE]). No pleural
fluid. Airway is unremarkable.

Upper Abdomen: Visualized portions of the liver, adrenal glands,
kidneys, spleen, pancreas, stomach and bowel are grossly
unremarkable.

Musculoskeletal: No worrisome lytic or sclerotic lesions.
IMPRESSION: 1. Small solid and ground-glass nodules, as detailed above.
Comparison imaging is not available at the time of dictation. No
follow-up needed if patient is low-risk (and has no known or
suspected primary neoplasm). Non-contrast chest CT can be considered
in 12 months if patient is high-risk. This recommendation follows
the consensus statement: Guidelines for Management of Incidental
Pulmonary Nodules Detected on CT Images: From the [HOSPITAL]
2. Aortic atherosclerosis (HOJIZ-DE7.7). Coronary artery
calcification.

## 2022-04-25 ENCOUNTER — Encounter: Payer: Self-pay | Admitting: *Deleted

## 2022-05-03 ENCOUNTER — Telehealth: Payer: Self-pay

## 2022-05-03 NOTE — Telephone Encounter (Signed)
Spoke with the patient regarding the referral to GYN oncology. Patient scheduled as new patient with Dr Berline Lopes on 05/16/2022. Patient given an arrival time of 11:00am.  Explained to the patient the the doctor will perform a pelvic exam at this visit. Patient given the policy that no visitors under the 16 yrs are allowed in the [facility]. Patient given the address/phone number for the clinic and that the center offers free valet service. Patient aware of the new mask mandate.

## 2022-05-14 ENCOUNTER — Encounter: Payer: Self-pay | Admitting: Gynecologic Oncology

## 2022-05-14 ENCOUNTER — Ambulatory Visit: Payer: 59 | Admitting: Gastroenterology

## 2022-05-15 ENCOUNTER — Encounter: Payer: Self-pay | Admitting: Gynecologic Oncology

## 2022-05-15 NOTE — Progress Notes (Unsigned)
GYNECOLOGIC ONCOLOGY NEW PATIENT CONSULTATION   Patient Name: Jennifer Anderson  Patient Age: 67 y.o. Date of Service: 05/16/22 Referring Provider: Julie Wilhelmson, WHNP  Primary Care Provider: Hemberg, Cambell Rickenbach V, NP Consulting Provider: Jonette Wassel, MD   Assessment/Plan:  Postmenopausal patient with complex adnexal mass and thickened endometrium in the setting of postmenopausal hormone replacement therapy.  We discussed recent imaging findings.  In the workup of her postmenopausal bleeding, imaging revealed a complex right adnexal lesion.  Overall, this mass is described as mostly cystic without significant findings that would raise significant concern for a malignancy.  We reviewed that additional imaging could be pursued in the form of an MRI if the goal was to avoid or delay surgery.    In regard to her thickened endometrium, endometrial biopsy, although scant, revealed benign endometrium.  Patient has been on hormone replacement therapy for some time.  She is on combination therapy with progesterone that should protect her endometrial lining.  We discussed that estrogen causes stimulation of the endometrium which can lead to overgrowth and development of precancer and cancer.  Sometimes, even in the setting of being on estrogen and progesterone, there can be an imbalance that causes overgrowth of the endometrium.  Although her biopsy is reassuring, it is scant sampling raises the possibility of not diagnosing underlying pathology.  If larger surgery to remove the ovary has not pursued, my recommendation would be to proceed with additional sampling using a D&C.  After discussing the risks and benefits of pursuing additional imaging versus moving forward with scheduling definitive surgery, the patient would like to proceed with definitive surgery.  Plan would be to remove bilateral tubes and ovaries.  The enlarged ovary would be placed in a bag for contained cyst drainage and removal.  The  ovary would be then sent to pathology for frozen section.  After the hysterectomy is performed, the uterus would also be sent for inspection of the endometrium.  In the setting of benign ovarian mass and endometrium, no additional procedures would be performed.  If a borderline ovarian tumor was found, additional staging procedures including peritoneal biopsies and omentectomy would be performed.  If an ovarian malignancy found, lymph node sampling would also be performed.  In the setting of endometrial cancer, if found during surgery, decisions about lymphadenectomy would be made based on frozen section review.  We reviewed the plan for a robotic assisted hysterectomy, bilateral salpingo-oophorectomy, possible staging, possible laparotomy. The risks of surgery were discussed in detail and she understands these to include infection; wound separation; hernia; vaginal cuff separation, injury to adjacent organs such as bowel, bladder, blood vessels, ureters and nerves; bleeding which may require blood transfusion; anesthesia risk; thromboembolic events; possible death; unforeseen complications; possible need for re-exploration; medical complications such as heart attack, stroke, pleural effusion and pneumonia; and, if full lymphadenectomy is performed the risk of lymphedema and lymphocyst. The patient will receive DVT and antibiotic prophylaxis as indicated. She voiced a clear understanding. She had the opportunity to ask questions. Perioperative instructions were reviewed with her. Prescriptions for post-op medications were sent to her pharmacy of choice.  A copy of this note was sent to the patient's referring provider.   65 minutes of total time was spent for this patient encounter, including preparation, face-to-face counseling with the patient and coordination of care, and documentation of the encounter.  Markie Frith, MD  Division of Gynecologic Oncology  Department of Obstetrics and Gynecology   [facility]  ___________________________________________    Chief Complaint: Chief Complaint  Patient presents with   Ovarian mass    History of Present Illness:  Jennifer Anderson is a 67 y.o. y.o. female who is seen in consultation at the request of Wilhelmson, Julie WHNP for an evaluation of adnexal mass and thickened endometrium.  The patient presented initially with postmenopausal bleeding in the setting of being on hormone replacement therapy for approximately 15 years with Prempro.  HRT is in the setting of significant menopausal symptoms including general malaise, poor sleep, hot flashes, and weight gain.  Pap test was done with her primary care provider - AGUS, HR HPV negative.   Pelvic ultrasound exam was performed on 04/29/2022 with a uterus measuring 8.7 x 4.2 x 6.4 cm.  Small posterior wall intramural leiomyoma noted.  Endometrium measures 14 mm, focally thickened and heterogenous at upper uterine segment.  Right ovary measures 8.3 x 4.2 x 5.1 cm.  Complex cystic lesion with heterogenous internal echogenicity measures 5.7 x 3.7 x 4.1 cm, cystic mass versus hemorrhagic cyst.  Left ovary normal in size and appearance.  No free fluid noted.  CA-125 on 1/24 was normal (18).  EMB on 1/24 revealed sparse small fragments of benign endometrium and benign endocervical and squamous mucosa.   Today, the patient describes initially developing twinge in her urethra or bladder in December that she thought was related to urinary tract infection.  She was started on antibiotics for presumed UTI but her urine studies came back negative for infection so she stopped the antibiotics.  Vaginal spotting began several days after the urinary tract symptoms started.  She had enough bleeding to wear a mini pad for 3 days.  She passed a small amount of tissue during this time.  She denies any associated pain or cramping.  Bleeding stopped subsequently and she has not had any since.  She  has IBS with chronic intermittent constipation, denies any change recently.  She denies any urinary symptoms now other than continued intermittent twinges in her urethra versus bladder that seem to happen when she sits for longer periods of time or with position changes.  She denies any weight changes.  PAST MEDICAL HISTORY:  Past Medical History:  Diagnosis Date   Hormone replacement therapy (HRT)    IBS (irritable bowel syndrome)      PAST SURGICAL HISTORY:  Past Surgical History:  Procedure Laterality Date   CESAREAN SECTION  04/08/1985   DILATION AND CURETTAGE OF UTERUS     TUBAL LIGATION      OB/GYN HISTORY:  OB History  Gravida Para Term Preterm AB Living  2 2          SAB IAB Ectopic Multiple Live Births               # Outcome Date GA Lbr Len/2nd Weight Sex Delivery Anes PTL Lv  2 Para           1 Para             No LMP recorded. Patient is postmenopausal.  Age at menarche: 15 Age at menopause: 4 Hx of HRT: See HPI Hx of STDs: Denies Last pap: 2023 - AGUS, HR HPV negative History of abnormal pap smears: Reports a remote history of an abnormal Pap smear during her pregnancy more than 30 years ago.  Says that this was treated with a D&C after she delivered.  SCREENING STUDIES:  Last mammogram: 2024  Last colonoscopy: 2008 Last bone mineral density: 2017  MEDICATIONS: Outpatient   Encounter Medications as of 05/16/2022  Medication Sig   OVER THE COUNTER MEDICATION Calcium with Vit D one every day   PREMPRO 0.625-5 MG tablet Take 1 tablet by mouth daily.   No facility-administered encounter medications on file as of 05/16/2022.    ALLERGIES:  No Known Allergies   FAMILY HISTORY:  Family History  Problem Relation Age of Onset   Heart disease Mother 62       MI   Heart disease Father 30       second MI in 60s   Diabetes Father    Stroke Paternal Grandfather    Colon cancer Neg Hx    Breast cancer Neg Hx    Ovarian cancer Neg Hx    Endometrial cancer Neg  Hx    Pancreatic cancer Neg Hx    Prostate cancer Neg Hx      SOCIAL HISTORY:  Social Connections: Socially Integrated (05/14/2022)   Social Connection and Isolation Panel [NHANES]    Frequency of Communication with Friends and Family: More than three times a week    Frequency of Social Gatherings with Friends and Family: More than three times a week    Attends Religious Services: More than 4 times per year    Active Member of Clubs or Organizations: Yes    Attends Club or Organization Meetings: More than 4 times per year    Marital Status: Married    REVIEW OF SYSTEMS:  Denies appetite changes, fevers, chills, fatigue, unexplained weight changes. Denies hearing loss, neck lumps or masses, mouth sores, ringing in ears or voice changes. Denies cough or wheezing.  Denies shortness of breath. Denies chest pain or palpitations. Denies leg swelling. Denies abdominal distention, pain, blood in stools, constipation, diarrhea, nausea, vomiting, or early satiety. Denies pain with intercourse, frequency, hematuria or incontinence. Denies hot flashes, pelvic pain, vaginal bleeding or vaginal discharge.   Denies joint pain, back pain or muscle pain/cramps. Denies itching, rash, or wounds. Denies dizziness, headaches, numbness or seizures. Denies swollen lymph nodes or glands, denies easy bruising or bleeding. Denies anxiety, depression, confusion, or decreased concentration.  Physical Exam:  Vital Signs for this encounter:  Blood pressure 117/86, pulse 74, temperature 98.5 F (36.9 C), temperature source Oral, resp. rate 14, height 5' 6.54" (1.69 m), weight 143 lb 3.2 oz (65 kg), SpO2 100 %. Body mass index is 22.74 kg/m. General: Alert, oriented, no acute distress.  HEENT: Normocephalic, atraumatic. Sclera anicteric.  Chest: Clear to auscultation bilaterally. No wheezes, rhonchi, or rales. Cardiovascular: Regular rate and rhythm, no murmurs, rubs, or gallops.  Abdomen: Normoactive bowel  sounds. Soft, nondistended, nontender to palpation. No masses or hepatosplenomegaly appreciated. No palpable fluid wave.  Extremities: Grossly normal range of motion. Warm, well perfused. No edema bilaterally.  Skin: No rashes or lesions.  Lymphatics: No cervical, supraclavicular, or inguinal adenopathy.  GU:  Normal external female genitalia. No lesions. No discharge or bleeding.             Bladder/urethra:  No lesions or masses, well supported bladder             Vagina: Mildly atrophic, no vaginal lesions.             Cervix: Normal appearing, no lesions.             Uterus:  Small, mobile, no parametrial involvement or nodularity.             Adnexa: Mass appreciated in the cul-de-sac on the left,   approximately 8-10 cm, mobile, no firmness or nodularity.  Rectal: Deferred.  LABORATORY AND RADIOLOGIC DATA:  Outside medical records were reviewed to synthesize the above history, along with the history and physical obtained during the visit.   Lab Results  Component Value Date   WBC 5.7 07/12/2015   HGB 13.5 07/12/2015   HCT 40.8 07/12/2015   PLT 256 07/12/2015   GLUCOSE 78 10/07/2012   CHOL 156 10/07/2012   TRIG 52 10/07/2012   HDL 65 10/07/2012   LDLCALC 81 10/07/2012   ALT <8 10/07/2012   AST 15 10/07/2012   NA 144 10/07/2012   K 4.6 10/07/2012   CL 108 10/07/2012   CREATININE 1.01 10/07/2012   BUN 19 10/07/2012   CO2 29 10/07/2012   TSH 2.696 10/07/2012    

## 2022-05-15 NOTE — H&P (View-Only) (Signed)
GYNECOLOGIC ONCOLOGY NEW PATIENT CONSULTATION   Patient Name: Jennifer Anderson  Patient Age: 67 y.o. Date of Service: 05/16/22 Referring Provider: Victorino December, [facility]  Primary Care Provider: Bridget Hartshorn, NP Consulting Provider: Jeral Pinch, MD   Assessment/Plan:  Postmenopausal patient with complex adnexal mass and thickened endometrium in the setting of postmenopausal hormone replacement therapy.  We discussed recent imaging findings.  In the workup of her postmenopausal bleeding, imaging revealed a complex right adnexal lesion.  Overall, this mass is described as mostly cystic without significant findings that would raise significant concern for a malignancy.  We reviewed that additional imaging could be pursued in the form of an MRI if the goal was to avoid or delay surgery.    In regard to her thickened endometrium, endometrial biopsy, although scant, revealed benign endometrium.  Patient has been on hormone replacement therapy for some time.  She is on combination therapy with progesterone that should protect her endometrial lining.  We discussed that estrogen causes stimulation of the endometrium which can lead to overgrowth and development of precancer and cancer.  Sometimes, even in the setting of being on estrogen and progesterone, there can be an imbalance that causes overgrowth of the endometrium.  Although her biopsy is reassuring, it is scant sampling raises the possibility of not diagnosing underlying pathology.  If larger surgery to remove the ovary has not pursued, my recommendation would be to proceed with additional sampling using a D&C.  After discussing the risks and benefits of pursuing additional imaging versus moving forward with scheduling definitive surgery, the patient would like to proceed with definitive surgery.  Plan would be to remove bilateral tubes and ovaries.  The enlarged ovary would be placed in a bag for contained cyst drainage and removal.  The  ovary would be then sent to pathology for frozen section.  After the hysterectomy is performed, the uterus would also be sent for inspection of the endometrium.  In the setting of benign ovarian mass and endometrium, no additional procedures would be performed.  If a borderline ovarian tumor was found, additional staging procedures including peritoneal biopsies and omentectomy would be performed.  If an ovarian malignancy found, lymph node sampling would also be performed.  In the setting of endometrial cancer, if found during surgery, decisions about lymphadenectomy would be made based on frozen section review.  We reviewed the plan for a robotic assisted hysterectomy, bilateral salpingo-oophorectomy, possible staging, possible laparotomy. The risks of surgery were discussed in detail and she understands these to include infection; wound separation; hernia; vaginal cuff separation, injury to adjacent organs such as bowel, bladder, blood vessels, ureters and nerves; bleeding which may require blood transfusion; anesthesia risk; thromboembolic events; possible death; unforeseen complications; possible need for re-exploration; medical complications such as heart attack, stroke, pleural effusion and pneumonia; and, if full lymphadenectomy is performed the risk of lymphedema and lymphocyst. The patient will receive DVT and antibiotic prophylaxis as indicated. She voiced a clear understanding. She had the opportunity to ask questions. Perioperative instructions were reviewed with her. Prescriptions for post-op medications were sent to her pharmacy of choice.  A copy of this note was sent to the patient's referring provider.   65 minutes of total time was spent for this patient encounter, including preparation, face-to-face counseling with the patient and coordination of care, and documentation of the encounter.  Jeral Pinch, MD  Division of Gynecologic Oncology  Department of Obstetrics and Gynecology   [facility]  ___________________________________________  Chief Complaint: Chief Complaint  Patient presents with   Ovarian mass    History of Present Illness:  Jennifer Anderson is a 67 y.o. y.o. female who is seen in consultation at the request of Victorino December [facility] for an evaluation of adnexal mass and thickened endometrium.  The patient presented initially with postmenopausal bleeding in the setting of being on hormone replacement therapy for approximately 15 years with Prempro.  HRT is in the setting of significant menopausal symptoms including general malaise, poor sleep, hot flashes, and weight gain.  Pap test was done with her primary care provider - AGUS, HR HPV negative.   Pelvic ultrasound exam was performed on 04/29/2022 with a uterus measuring 8.7 x 4.2 x 6.4 cm.  Small posterior wall intramural leiomyoma noted.  Endometrium measures 14 mm, focally thickened and heterogenous at upper uterine segment.  Right ovary measures 8.3 x 4.2 x 5.1 cm.  Complex cystic lesion with heterogenous internal echogenicity measures 5.7 x 3.7 x 4.1 cm, cystic mass versus hemorrhagic cyst.  Left ovary normal in size and appearance.  No free fluid noted.  CA-125 on 1/24 was normal (18).  EMB on 1/24 revealed sparse small fragments of benign endometrium and benign endocervical and squamous mucosa.   Today, the patient describes initially developing twinge in her urethra or bladder in December that she thought was related to urinary tract infection.  She was started on antibiotics for presumed UTI but her urine studies came back negative for infection so she stopped the antibiotics.  Vaginal spotting began several days after the urinary tract symptoms started.  She had enough bleeding to wear a mini pad for 3 days.  She passed a small amount of tissue during this time.  She denies any associated pain or cramping.  Bleeding stopped subsequently and she has not had any since.  She  has IBS with chronic intermittent constipation, denies any change recently.  She denies any urinary symptoms now other than continued intermittent twinges in her urethra versus bladder that seem to happen when she sits for longer periods of time or with position changes.  She denies any weight changes.  PAST MEDICAL HISTORY:  Past Medical History:  Diagnosis Date   Hormone replacement therapy (HRT)    IBS (irritable bowel syndrome)      PAST SURGICAL HISTORY:  Past Surgical History:  Procedure Laterality Date   CESAREAN SECTION  04/08/1985   DILATION AND CURETTAGE OF UTERUS     TUBAL LIGATION      OB/GYN HISTORY:  OB History  Gravida Para Term Preterm AB Living  2 2          SAB IAB Ectopic Multiple Live Births               # Outcome Date GA Lbr Len/2nd Weight Sex Delivery Anes PTL Lv  2 Para           1 Para             No LMP recorded. Patient is postmenopausal.  Age at menarche: 42 Age at menopause: 4 Hx of HRT: See HPI Hx of STDs: Denies Last pap: 2023 - AGUS, HR HPV negative History of abnormal pap smears: Reports a remote history of an abnormal Pap smear during her pregnancy more than 30 years ago.  Says that this was treated with a D&C after she delivered.  SCREENING STUDIES:  Last mammogram: 2024  Last colonoscopy: 2008 Last bone mineral density: 2017  MEDICATIONS: Outpatient  Encounter Medications as of 05/16/2022  Medication Sig   OVER THE COUNTER MEDICATION Calcium with Vit D one every day   PREMPRO 0.625-5 MG tablet Take 1 tablet by mouth daily.   No facility-administered encounter medications on file as of 05/16/2022.    ALLERGIES:  No Known Allergies   FAMILY HISTORY:  Family History  Problem Relation Age of Onset   Heart disease Mother 32       MI   Heart disease Father 46       second MI in 44s   Diabetes Father    Stroke Paternal Grandfather    Colon cancer Neg Hx    Breast cancer Neg Hx    Ovarian cancer Neg Hx    Endometrial cancer Neg  Hx    Pancreatic cancer Neg Hx    Prostate cancer Neg Hx      SOCIAL HISTORY:  Social Connections: Socially Integrated (05/14/2022)   Social Connection and Isolation Panel [NHANES]    Frequency of Communication with Friends and Family: More than three times a week    Frequency of Social Gatherings with Friends and Family: More than three times a week    Attends Religious Services: More than 4 times per year    Active Member of [employer] or Organizations: Yes    Attends Music therapist: More than 4 times per year    Marital Status: Married    REVIEW OF SYSTEMS:  Denies appetite changes, fevers, chills, fatigue, unexplained weight changes. Denies hearing loss, neck lumps or masses, mouth sores, ringing in ears or voice changes. Denies cough or wheezing.  Denies shortness of breath. Denies chest pain or palpitations. Denies leg swelling. Denies abdominal distention, pain, blood in stools, constipation, diarrhea, nausea, vomiting, or early satiety. Denies pain with intercourse, frequency, hematuria or incontinence. Denies hot flashes, pelvic pain, vaginal bleeding or vaginal discharge.   Denies joint pain, back pain or muscle pain/cramps. Denies itching, rash, or wounds. Denies dizziness, headaches, numbness or seizures. Denies swollen lymph nodes or glands, denies easy bruising or bleeding. Denies anxiety, depression, confusion, or decreased concentration.  Physical Exam:  Vital Signs for this encounter:  Blood pressure 117/86, pulse 74, temperature 98.5 F (36.9 C), temperature source Oral, resp. rate 14, height 5' 6.54" (1.69 m), weight 143 lb 3.2 oz (65 kg), SpO2 100 %. Body mass index is 22.74 kg/m. General: Alert, oriented, no acute distress.  HEENT: Normocephalic, atraumatic. Sclera anicteric.  Chest: Clear to auscultation bilaterally. No wheezes, rhonchi, or rales. Cardiovascular: Regular rate and rhythm, no murmurs, rubs, or gallops.  Abdomen: Normoactive bowel  sounds. Soft, nondistended, nontender to palpation. No masses or hepatosplenomegaly appreciated. No palpable fluid wave.  Extremities: Grossly normal range of motion. Warm, well perfused. No edema bilaterally.  Skin: No rashes or lesions.  Lymphatics: No cervical, supraclavicular, or inguinal adenopathy.  GU:  Normal external female genitalia. No lesions. No discharge or bleeding.             Bladder/urethra:  No lesions or masses, well supported bladder             Vagina: Mildly atrophic, no vaginal lesions.             Cervix: Normal appearing, no lesions.             Uterus:  Small, mobile, no parametrial involvement or nodularity.             Adnexa: Mass appreciated in the cul-de-sac on the left,  approximately 8-10 cm, mobile, no firmness or nodularity.  Rectal: Deferred.  LABORATORY AND RADIOLOGIC DATA:  Outside medical records were reviewed to synthesize the above history, along with the history and physical obtained during the visit.   Lab Results  Component Value Date   WBC 5.7 07/12/2015   HGB 13.5 07/12/2015   HCT 40.8 07/12/2015   PLT 256 07/12/2015   GLUCOSE 78 10/07/2012   CHOL 156 10/07/2012   TRIG 52 10/07/2012   HDL 65 10/07/2012   LDLCALC 81 10/07/2012   ALT <8 10/07/2012   AST 15 10/07/2012   NA 144 10/07/2012   K 4.6 10/07/2012   CL 108 10/07/2012   CREATININE 1.01 10/07/2012   BUN 19 10/07/2012   CO2 29 10/07/2012   TSH 2.696 10/07/2012

## 2022-05-16 ENCOUNTER — Inpatient Hospital Stay: Payer: Managed Care, Other (non HMO) | Attending: Gynecologic Oncology | Admitting: Gynecologic Oncology

## 2022-05-16 ENCOUNTER — Encounter: Payer: Self-pay | Admitting: Oncology

## 2022-05-16 ENCOUNTER — Encounter: Payer: Self-pay | Admitting: Gynecologic Oncology

## 2022-05-16 ENCOUNTER — Ambulatory Visit
Admission: RE | Admit: 2022-05-16 | Discharge: 2022-05-16 | Disposition: A | Discharge status: Home / Self Care | Payer: Self-pay | Source: Ambulatory Visit | Attending: Gynecologic Oncology | Admitting: Gynecologic Oncology

## 2022-05-16 ENCOUNTER — Other Ambulatory Visit: Payer: Self-pay

## 2022-05-16 ENCOUNTER — Inpatient Hospital Stay: Payer: Managed Care, Other (non HMO) | Admitting: Gynecologic Oncology

## 2022-05-16 VITALS — BP 117/86 | HR 74 | Temp 98.5°F | Resp 14 | Ht 66.54 in | Wt 143.2 lb

## 2022-05-16 DIAGNOSIS — N951 Menopausal and female climacteric states: Secondary | ICD-10-CM | POA: Insufficient documentation

## 2022-05-16 DIAGNOSIS — K589 Irritable bowel syndrome without diarrhea: Secondary | ICD-10-CM | POA: Insufficient documentation

## 2022-05-16 DIAGNOSIS — N838 Other noninflammatory disorders of ovary, fallopian tube and broad ligament: Secondary | ICD-10-CM

## 2022-05-16 DIAGNOSIS — N950 Postmenopausal bleeding: Secondary | ICD-10-CM

## 2022-05-16 DIAGNOSIS — R232 Flushing: Secondary | ICD-10-CM | POA: Insufficient documentation

## 2022-05-16 DIAGNOSIS — R5381 Other malaise: Secondary | ICD-10-CM | POA: Diagnosis not present

## 2022-05-16 DIAGNOSIS — D3911 Neoplasm of uncertain behavior of right ovary: Secondary | ICD-10-CM | POA: Diagnosis not present

## 2022-05-16 DIAGNOSIS — Z79890 Hormone replacement therapy (postmenopausal): Secondary | ICD-10-CM | POA: Diagnosis not present

## 2022-05-16 DIAGNOSIS — R9389 Abnormal findings on diagnostic imaging of other specified body structures: Secondary | ICD-10-CM | POA: Diagnosis not present

## 2022-05-16 MED ORDER — TRAMADOL HCL 50 MG PO TABS: 50.0000 mg | ORAL_TABLET | Freq: Four times a day (QID) | ORAL | 0 refills | Status: DC | PRN

## 2022-05-16 MED ORDER — SENNOSIDES-DOCUSATE SODIUM 8.6-50 MG PO TABS: 2.0000 | ORAL_TABLET | Freq: Every day | ORAL | 0 refills | Status: DC

## 2022-05-16 NOTE — Patient Instructions (Addendum)
Preparing for your Surgery  Plan for surgery on June 06, 2022 with Dr. Jeral Pinch at [place] [facility] will be scheduled for robotic assisted total laparoscopic hysterectomy (removal of the uterus and cervix), bilateral salpingo-oophorectomy (removal of both ovaries and fallopian tubes), possible staging if a precancer or cancer is identified, possible laparotomy (larger incision on your abdomen if needed).   Pre-operative Testing -You will receive a phone call from presurgical testing at [facility] to arrange for a pre-operative appointment and lab work.  -Bring your insurance card, copy of an advanced directive if applicable, medication list  -At that visit, you will be asked to sign a consent for a possible blood transfusion in case a transfusion becomes necessary during surgery.  The need for a blood transfusion is rare but having consent is a necessary part of your care.     -You should not be taking blood thinners or aspirin at least ten days prior to surgery unless instructed by your surgeon.  -Do not take supplements such as fish oil (omega 3), red yeast rice, turmeric before your surgery. You want to avoid medications with aspirin in them including headache powders such as BC or Goody's), Excedrin migraine.  Day Before Surgery at [place] will be asked to take in a light diet the day before surgery. You will be advised you can have clear liquids up until 3 hours before your surgery.    Eat a light diet the day before surgery.  Examples including soups, broths, toast, yogurt, mashed potatoes.  AVOID GAS PRODUCING FOODS AND BEVERAGES. Things to avoid include carbonated beverages (fizzy beverages, sodas), raw fruits and raw vegetables (uncooked), or beans.   If your bowels are filled with gas, your surgeon will have difficulty visualizing your pelvic organs which increases your surgical risks.  Your role in recovery Your role is to become active as soon as  directed by your doctor, while still giving yourself time to heal.  Rest when you feel tired. You will be asked to do the following in order to speed your recovery:  - Cough and breathe deeply. This helps to clear and expand your lungs and can prevent pneumonia after surgery.  - [place]. Do mild physical activity. Walking or moving your legs help your circulation and body functions return to normal. Do not try to get up or walk alone the first time after surgery.   -If you develop swelling on one leg or the other, pain in the back of your leg, redness/warmth in one of your legs, please call the office or go to the Emergency Room to have a doppler to rule out a blood clot. For shortness of breath, chest pain-seek care in the Emergency Room as soon as possible. - Actively manage your pain. Managing your pain lets you move in comfort. We will ask you to rate your pain on a scale of zero to 10. It is your responsibility to tell your doctor or nurse where and how much you hurt so your pain can be treated.  Special Considerations -If you are diabetic, you may be placed on insulin after surgery to have closer control over your blood sugars to promote healing and recovery.  This does not mean that you will be discharged on insulin.  If applicable, your oral antidiabetics will be resumed when you are tolerating a solid diet.  -Your final pathology results from surgery should be available around one week after surgery and  the results will be relayed to you when available.  -FMLA forms can be faxed to [phone] and please allow 5-7 business days for completion.  Pain Management After Surgery -You have been prescribed your pain medication and bowel regimen medications before surgery so that you can have these available when you are discharged from the hospital. The pain medication is for use ONLY AFTER surgery and a new prescription will not be given.   -Make sure that you have Tylenol  and Ibuprofen IF YOU ARE ABLE TO TAKE THESE MEDICATIONS at home to use on a regular basis after surgery for pain control. We recommend alternating the medications every hour to six hours since they work differently and are processed in the body differently for pain relief.  -Review the attached handout on narcotic use and their risks and side effects.   Bowel Regimen -You have been prescribed Sennakot-S to take nightly to prevent constipation especially if you are taking the narcotic pain medication intermittently.  It is important to prevent constipation and drink adequate amounts of liquids. You can stop taking this medication when you are not taking pain medication and you are back on your normal bowel routine.  Risks of Surgery Risks of surgery are low but include bleeding, infection, damage to surrounding structures, re-operation, blood clots, and very rarely death.   Blood Transfusion Information (For the consent to be signed before surgery)  We will be checking your blood type before surgery so in case of emergencies, we will know what type of blood you would need.                                            WHAT IS A BLOOD TRANSFUSION?  A transfusion is the replacement of blood or some of its parts. Blood is made up of multiple cells which provide different functions. Red blood cells carry oxygen and are used for blood loss replacement. White blood cells fight against infection. Platelets control bleeding. Plasma helps clot blood. Other blood products are available for specialized needs, such as hemophilia or other clotting disorders. BEFORE THE TRANSFUSION  Who gives blood for transfusions?  You may be able to donate blood to be used at a later date on yourself (autologous donation). Relatives can be asked to donate blood. This is generally not any safer than if you have received blood from a stranger. The same precautions are taken to ensure safety when a relative's blood is  donated. Healthy volunteers who are fully evaluated to make sure their blood is safe. This is blood bank blood. Transfusion therapy is the safest it has ever been in the practice of medicine. Before blood is taken from a donor, a complete history is taken to make sure that person has no history of diseases nor engages in risky social behavior (examples are intravenous drug use or sexual activity with multiple partners). The donor's travel history is screened to minimize risk of transmitting infections, such as malaria. The donated blood is tested for signs of infectious diseases, such as HIV and hepatitis. The blood is then tested to be sure it is compatible with you in order to minimize the chance of a transfusion reaction. If you or a relative donates blood, this is often done in anticipation of surgery and is not appropriate for emergency situations. It takes many days to process the donated blood. RISKS AND  COMPLICATIONS Although transfusion therapy is very safe and saves many lives, the main dangers of transfusion include:  Getting an infectious disease. Developing a transfusion reaction. This is an allergic reaction to something in the blood you were given. Every precaution is taken to prevent this. The decision to have a blood transfusion has been considered carefully by your caregiver before blood is given. Blood is not given unless the benefits outweigh the risks.  AFTER SURGERY INSTRUCTIONS  Return to work: 4-6 weeks if applicable  Activity: 1. Be up and out of the bed during the day.  Take a nap if needed.  You may walk up steps but be careful and use the hand rail.  Stair climbing will tire you more than you think, you may need to stop part way and rest.   2. No lifting or straining for 6 weeks over 10 pounds. No pushing, pulling, straining for 6 weeks.  3. No driving for around 1 week(s).  Do not drive if you are taking narcotic pain medicine and make sure that your reaction time has  returned.   4. You can shower as soon as the next day after surgery. Shower daily.  Use your regular soap and water (not directly on the incision) and pat your incision(s) dry afterwards; don't rub.  No tub baths or submerging your body in water until cleared by your surgeon. If you have the soap that was given to you by pre-surgical testing that was used before surgery, you do not need to use it afterwards because this can irritate your incisions.   5. No sexual activity and nothing in the vagina for 10-12 weeks.  6. You may experience a small amount of clear drainage from your incisions, which is normal.  If the drainage persists, increases, or changes color please call the office.  7. Do not use creams, lotions, or ointments such as neosporin on your incisions after surgery until advised by your surgeon because they can cause removal of the dermabond glue on your incisions.    8. You may experience vaginal spotting after surgery or around the 6-8 week mark from surgery when the stitches at the top of the vagina begin to dissolve.  The spotting is normal but if you experience heavy bleeding, call our office.  9. Take Tylenol or ibuprofen first for pain if you are able to take these medications and only use narcotic pain medication for severe pain not relieved by the Tylenol or Ibuprofen.  Monitor your Tylenol intake to a max of 4,000 mg in a 24 hour period. You can alternate these medications after surgery.  Diet: 1. Low sodium Heart Healthy Diet is recommended but you are cleared to resume your normal (before surgery) diet after your procedure.  2. It is safe to use a laxative, such as Miralax or Colace, if you have difficulty moving your bowels. You have been prescribed Sennakot-S to take at bedtime every evening after surgery to keep bowel movements regular and to prevent constipation.    Wound Care: 1. Keep clean and dry.  Shower daily.  Reasons to call the Doctor: Fever - Oral  temperature greater than 100.4 degrees Fahrenheit Foul-smelling vaginal discharge Difficulty urinating Nausea and vomiting Increased pain at the site of the incision that is unrelieved with pain medicine. Difficulty breathing with or without chest pain New calf pain especially if only on one side Sudden, continuing increased vaginal bleeding with or without clots.   Contacts: For questions or concerns you  should contact:  Dr. Jeral Pinch at [phone]  Joylene John, NP at [phone]  After Hours: call [phone] and have the GYN Oncologist paged/contacted (after 5 pm or on the weekends).  Messages sent via mychart are for non-urgent matters and are not responded to after hours so for urgent needs, please call the after hours number.

## 2022-05-16 NOTE — Progress Notes (Signed)
Outside order entered to [employer] US pelvis from [facility] done on 04/29/22.

## 2022-05-16 NOTE — Patient Instructions (Signed)
Preparing for your Surgery   Plan for surgery on June 06, 2022 with Dr. Jeral Pinch at [place] will be scheduled for robotic assisted total laparoscopic hysterectomy (removal of the uterus and cervix), bilateral salpingo-oophorectomy (removal of both ovaries and fallopian tubes), possible staging if a precancer or cancer is identified, possible laparotomy (larger incision on your abdomen if needed).    Pre-operative Testing -You will receive a phone call from presurgical testing at [facility] to arrange for a pre-operative appointment and lab work.   -Bring your insurance card, copy of an advanced directive if applicable, medication list   -At that visit, you will be asked to sign a consent for a possible blood transfusion in case a transfusion becomes necessary during surgery.  The need for a blood transfusion is rare but having consent is a necessary part of your care.      -You should not be taking blood thinners or aspirin at least ten days prior to surgery unless instructed by your surgeon.   -Do not take supplements such as fish oil (omega 3), red yeast rice, turmeric before your surgery. You want to avoid medications with aspirin in them including headache powders such as BC or Goody's), Excedrin migraine.   Day Before Surgery at [place] will be asked to take in a light diet the day before surgery. You will be advised you can have clear liquids up until 3 hours before your surgery.     Eat a light diet the day before surgery.  Examples including soups, broths, toast, yogurt, mashed potatoes.  AVOID GAS PRODUCING FOODS AND BEVERAGES. Things to avoid include carbonated beverages (fizzy beverages, sodas), raw fruits and raw vegetables (uncooked), or beans.    If your bowels are filled with gas, your surgeon will have difficulty visualizing your pelvic organs which increases your surgical risks.   Your role in recovery Your role is to become active as  soon as directed by your doctor, while still giving yourself time to heal.  Rest when you feel tired. You will be asked to do the following in order to speed your recovery:   - Cough and breathe deeply. This helps to clear and expand your lungs and can prevent pneumonia after surgery.  - [place]. Do mild physical activity. Walking or moving your legs help your circulation and body functions return to normal. Do not try to get up or walk alone the first time after surgery.   -If you develop swelling on one leg or the other, pain in the back of your leg, redness/warmth in one of your legs, please call the office or go to the Emergency Room to have a doppler to rule out a blood clot. For shortness of breath, chest pain-seek care in the Emergency Room as soon as possible. - Actively manage your pain. Managing your pain lets you move in comfort. We will ask you to rate your pain on a scale of zero to 10. It is your responsibility to tell your doctor or nurse where and how much you hurt so your pain can be treated.   Special Considerations -If you are diabetic, you may be placed on insulin after surgery to have closer control over your blood sugars to promote healing and recovery.  This does not mean that you will be discharged on insulin.  If applicable, your oral antidiabetics will be resumed when you are tolerating a solid diet.   -Your final  pathology results from surgery should be available around one week after surgery and the results will be relayed to you when available.   -FMLA forms can be faxed to [phone] and please allow 5-7 business days for completion.   Pain Management After Surgery -You have been prescribed your pain medication and bowel regimen medications before surgery so that you can have these available when you are discharged from the hospital. The pain medication is for use ONLY AFTER surgery and a new prescription will not be given.    -Make sure that you  have Tylenol and Ibuprofen IF YOU ARE ABLE TO TAKE THESE MEDICATIONS at home to use on a regular basis after surgery for pain control. We recommend alternating the medications every hour to six hours since they work differently and are processed in the body differently for pain relief.   -Review the attached handout on narcotic use and their risks and side effects.    Bowel Regimen -You have been prescribed Sennakot-S to take nightly to prevent constipation especially if you are taking the narcotic pain medication intermittently.  It is important to prevent constipation and drink adequate amounts of liquids. You can stop taking this medication when you are not taking pain medication and you are back on your normal bowel routine.   Risks of Surgery Risks of surgery are low but include bleeding, infection, damage to surrounding structures, re-operation, blood clots, and very rarely death.     Blood Transfusion Information (For the consent to be signed before surgery)   We will be checking your blood type before surgery so in case of emergencies, we will know what type of blood you would need.                                             WHAT IS A BLOOD TRANSFUSION?   A transfusion is the replacement of blood or some of its parts. Blood is made up of multiple cells which provide different functions. Red blood cells carry oxygen and are used for blood loss replacement. White blood cells fight against infection. Platelets control bleeding. Plasma helps clot blood. Other blood products are available for specialized needs, such as hemophilia or other clotting disorders. BEFORE THE TRANSFUSION  Who gives blood for transfusions?  You may be able to donate blood to be used at a later date on yourself (autologous donation). Relatives can be asked to donate blood. This is generally not any safer than if you have received blood from a stranger. The same precautions are taken to ensure safety when a  relative's blood is donated. Healthy volunteers who are fully evaluated to make sure their blood is safe. This is blood bank blood. Transfusion therapy is the safest it has ever been in the practice of medicine. Before blood is taken from a donor, a complete history is taken to make sure that person has no history of diseases nor engages in risky social behavior (examples are intravenous drug use or sexual activity with multiple partners). The donor's travel history is screened to minimize risk of transmitting infections, such as malaria. The donated blood is tested for signs of infectious diseases, such as HIV and hepatitis. The blood is then tested to be sure it is compatible with you in order to minimize the chance of a transfusion reaction. If you or a relative donates blood, this is  often done in anticipation of surgery and is not appropriate for emergency situations. It takes many days to process the donated blood. RISKS AND COMPLICATIONS Although transfusion therapy is very safe and saves many lives, the main dangers of transfusion include:  Getting an infectious disease. Developing a transfusion reaction. This is an allergic reaction to something in the blood you were given. Every precaution is taken to prevent this. The decision to have a blood transfusion has been considered carefully by your caregiver before blood is given. Blood is not given unless the benefits outweigh the risks.   AFTER SURGERY INSTRUCTIONS   Return to work: 4-6 weeks if applicable   Activity: 1. Be up and out of the bed during the day.  Take a nap if needed.  You may walk up steps but be careful and use the hand rail.  Stair climbing will tire you more than you think, you may need to stop part way and rest.    2. No lifting or straining for 6 weeks over 10 pounds. No pushing, pulling, straining for 6 weeks.   3. No driving for around 1 week(s).  Do not drive if you are taking narcotic pain medicine and make sure that  your reaction time has returned.    4. You can shower as soon as the next day after surgery. Shower daily.  Use your regular soap and water (not directly on the incision) and pat your incision(s) dry afterwards; don't rub.  No tub baths or submerging your body in water until cleared by your surgeon. If you have the soap that was given to you by pre-surgical testing that was used before surgery, you do not need to use it afterwards because this can irritate your incisions.    5. No sexual activity and nothing in the vagina for 10-12 weeks.   6. You may experience a small amount of clear drainage from your incisions, which is normal.  If the drainage persists, increases, or changes color please call the office.   7. Do not use creams, lotions, or ointments such as neosporin on your incisions after surgery until advised by your surgeon because they can cause removal of the dermabond glue on your incisions.     8. You may experience vaginal spotting after surgery or around the 6-8 week mark from surgery when the stitches at the top of the vagina begin to dissolve.  The spotting is normal but if you experience heavy bleeding, call our office.   9. Take Tylenol or ibuprofen first for pain if you are able to take these medications and only use narcotic pain medication for severe pain not relieved by the Tylenol or Ibuprofen.  Monitor your Tylenol intake to a max of 4,000 mg in a 24 hour period. You can alternate these medications after surgery.   Diet: 1. Low sodium Heart Healthy Diet is recommended but you are cleared to resume your normal (before surgery) diet after your procedure.   2. It is safe to use a laxative, such as Miralax or Colace, if you have difficulty moving your bowels. You have been prescribed Sennakot-S to take at bedtime every evening after surgery to keep bowel movements regular and to prevent constipation.     Wound Care: 1. Keep clean and dry.  Shower daily.   Reasons to call  the Doctor: Fever - Oral temperature greater than 100.4 degrees Fahrenheit Foul-smelling vaginal discharge Difficulty urinating Nausea and vomiting Increased pain at the site of the incision  that is unrelieved with pain medicine. Difficulty breathing with or without chest pain New calf pain especially if only on one side Sudden, continuing increased vaginal bleeding with or without clots.   Contacts: For questions or concerns you should contact:   Dr. Jeral Pinch at [phone]   Joylene John, NP at [phone]   After Hours: call [phone] and have the GYN Oncologist paged/contacted (after 5 pm or on the weekends).   Messages sent via mychart are for non-urgent matters and are not responded to after hours so for urgent needs, please call the after hours number.

## 2022-05-16 NOTE — Progress Notes (Signed)
Patient here for new patient consultation with Dr. Berline Lopes and for a pre-operative appointment prior to her scheduled surgery on June 06, 2022. She is scheduled for robotic assisted total laparoscopic hysterectomy, bilateral salpingo-oophorectomy, possible staging if a precancer or cancer is identified, possible laparotomy. The surgery was discussed in detail.  See after visit summary for additional details.    Discussed post-op pain management in detail including the aspects of the enhanced recovery pathway.  Advised her that a new prescription would be sent in for tramadol and it is only to be used for after her upcoming surgery.  We discussed the use of tylenol post-op and to monitor for a maximum of 4,000 mg in a 24 hour period.  Also prescribed sennakot to be used after surgery and to hold if having loose stools.  Discussed bowel regimen in detail.     Discussed the use of SCDs and measures to take at home to prevent DVT including frequent mobility.  Reportable signs and symptoms of DVT discussed. Post-operative instructions discussed and expectations for after surgery. Incisional care discussed as well including reportable signs and symptoms including erythema, drainage, wound separation.     5 minutes spent with the patient.  Verbalizing understanding of material discussed. No needs or concerns voiced at the end of the visit.   Advised patient to call for any needs.  Advised that her post-operative medications had been prescribed and could be picked up at any time.    This appointment is included in the global surgical bundle as pre-operative teaching and has no charge.

## 2022-05-21 ENCOUNTER — Telehealth: Payer: Self-pay | Admitting: *Deleted

## 2022-05-21 NOTE — Telephone Encounter (Signed)
Per patient request emailed FMLA

## 2022-05-24 NOTE — Patient Instructions (Signed)
SURGICAL WAITING ROOM VISITATION Patients having surgery or a procedure may have no more than 2 support people in the waiting area - these visitors may rotate.    If the patient needs to stay at the hospital during part of their recovery, the visitor guidelines for inpatient rooms apply. Pre-op nurse will coordinate an appropriate time for 1 support person to accompany patient in pre-op.  This support person may not rotate.    Please refer to the [facility] website for the visitor guidelines for Inpatients (after your surgery is over and you are in a regular room).   Due to an increase in RSV and influenza rates and associated hospitalizations, children ages 48 and under may not visit patients in [place].     Your procedure is scheduled on: 06-06-22   Report to [facility] Main Entrance    Report to admitting at 5:15 AM   Call this number if you have problems the morning of surgery [phone]   Follow a light diet the day before surgery (avoid gas producing foods)   Do not eat food :After Midnight.   After Midnight you may have the following liquids until 4:30 AM DAY OF SURGERY  Water Non-Citrus Juices (without pulp, NO RED) Carbonated Beverages Black Coffee (NO MILK/CREAM OR CREAMERS, sugar ok)  Clear Tea (NO MILK/CREAM OR CREAMERS, sugar ok) regular and decaf                             Plain Jell-O (NO RED)                                           Fruit ices (not with fruit pulp, NO RED)                                     Popsicles (NO RED)                                                               Sports drinks like Gatorade (NO RED)                      If you have questions, please contact your surgeon's office.   FOLLOW BOWEL PREP AND ANY ADDITIONAL PRE OP INSTRUCTIONS YOU RECEIVED FROM YOUR SURGEON'S OFFICE!!!     Oral Hygiene is also important to reduce your risk of infection.                                    Remember - BRUSH YOUR  TEETH THE MORNING OF SURGERY WITH YOUR REGULAR TOOTHPASTE   Do NOT smoke after Midnight   Take these medicines the morning of surgery with A SIP OF WATER:   Okay to use eydrops                              You may not have any metal  on your body including hair pins, jewelry, and body piercing             Do not wear make-up, lotions, powders, perfumes or deodorant  Do not wear nail polish including gel and S&S, artificial/acrylic nails, or any other type of covering on natural nails including finger and toenails. If you have artificial nails, gel coating, etc. that needs to be removed by a nail salon please have this removed prior to surgery or surgery may need to be canceled/ delayed if the surgeon/ anesthesia feels like they are unable to be safely monitored.   Do not shave  48 hours prior to surgery.    Do not bring valuables to the hospital. [place].   Contacts, dentures or bridgework may not be worn into surgery.   DO NOT [place]. PHARMACY WILL DISPENSE MEDICATIONS LISTED ON YOUR MEDICATION LIST TO YOU DURING YOUR ADMISSION [place]!    Patients discharged on the day of surgery will not be allowed to drive home.  Someone NEEDS to stay with you for the first 24 hours after anesthesia.   Special Instructions: Bring a copy of your healthcare power of attorney and living will documents the day of surgery if you haven't scanned them before.              Please read over the following fact sheets you were given: IF [place] [phone]  If you received a COVID test during your pre-op visit  it is requested that you wear a mask when out in public, stay away from anyone that may not be feeling well and notify your surgeon if you develop symptoms. If you test positive for Covid or have been in contact with anyone that has tested positive in the last 10 days  please notify you surgeon.  [facility] - Preparing for Surgery Before surgery, you can play an important role.  Because skin is not sterile, your skin needs to be as free of germs as possible.  You can reduce the number of germs on your skin by washing with CHG (chlorahexidine gluconate) soap before surgery.  CHG is an antiseptic cleaner which kills germs and bonds with the skin to continue killing germs even after washing. Please DO NOT use if you have an allergy to CHG or antibacterial soaps.  If your skin becomes reddened/irritated stop using the CHG and inform your nurse when you arrive at Short Stay. Do not shave (including legs and underarms) for at least 48 hours prior to the first CHG shower.  You may shave your face/neck.  Please follow these instructions carefully:  1.  Shower with CHG Soap the night before surgery and the  morning of surgery.  2.  If you choose to wash your hair, wash your hair first as usual with your normal  shampoo.  3.  After you shampoo, rinse your hair and body thoroughly to remove the shampoo.                             4.  Use CHG as you would any other liquid soap.  You can apply chg directly to the skin and wash.  Gently with a scrungie or clean washcloth.  5.  Apply the CHG Soap to your body ONLY FROM THE NECK DOWN.   Do  not use on face/ open                           Wound or open sores. Avoid contact with eyes, ears mouth and   genitals (private parts).                       Wash face,  Genitals (private parts) with your normal soap.             6.  Wash thoroughly, paying special attention to the area where your    surgery  will be performed.  7.  Thoroughly rinse your body with warm water from the neck down.  8.  DO NOT shower/wash with your normal soap after using and rinsing off the CHG Soap.                9.  Pat yourself dry with a clean towel.            10.  Wear clean pajamas.            11.  Place clean sheets on your bed the night of your  first shower and do not  sleep with pets. Day of Surgery : Do not apply any lotions/deodorants the morning of surgery.  Please wear clean clothes to the hospital/surgery center.  FAILURE TO FOLLOW THESE INSTRUCTIONS MAY RESULT IN THE CANCELLATION OF YOUR SURGERY  PATIENT SIGNATURE_________________________________  NURSE SIGNATURE__________________________________  ________________________________________________________________________   WHAT IS A BLOOD TRANSFUSION? Blood Transfusion Information  A transfusion is the replacement of blood or some of its parts. Blood is made up of multiple cells which provide different functions. Red blood cells carry oxygen and are used for blood loss replacement. White blood cells fight against infection. Platelets control bleeding. Plasma helps clot blood. Other blood products are available for specialized needs, such as hemophilia or other clotting disorders. BEFORE THE TRANSFUSION  Who gives blood for transfusions?  Healthy volunteers who are fully evaluated to make sure their blood is safe. This is blood bank blood. Transfusion therapy is the safest it has ever been in the practice of medicine. Before blood is taken from a donor, a complete history is taken to make sure that person has no history of diseases nor engages in risky social behavior (examples are intravenous drug use or sexual activity with multiple partners). The donor's travel history is screened to minimize risk of transmitting infections, such as malaria. The donated blood is tested for signs of infectious diseases, such as HIV and hepatitis. The blood is then tested to be sure it is compatible with you in order to minimize the chance of a transfusion reaction. If you or a relative donates blood, this is often done in anticipation of surgery and is not appropriate for emergency situations. It takes many days to process the donated blood. RISKS AND COMPLICATIONS Although transfusion  therapy is very safe and saves many lives, the main dangers of transfusion include:  Getting an infectious disease. Developing a transfusion reaction. This is an allergic reaction to something in the blood you were given. Every precaution is taken to prevent this. The decision to have a blood transfusion has been considered carefully by your caregiver before blood is given. Blood is not given unless the benefits outweigh the risks. AFTER THE TRANSFUSION Right after receiving a blood transfusion, you will usually feel much better and more energetic. This is especially true if your  red blood cells have gotten low (anemic). The transfusion raises the level of the red blood cells which carry oxygen, and this usually causes an energy increase. The nurse administering the transfusion will monitor you carefully for complications. HOME CARE INSTRUCTIONS  No special instructions are needed after a transfusion. You may find your energy is better. Speak with your caregiver about any limitations on activity for underlying diseases you may have. SEEK MEDICAL CARE IF:  Your condition is not improving after your transfusion. You develop redness or irritation at the intravenous (IV) site. SEEK IMMEDIATE MEDICAL CARE IF:  Any of the following symptoms occur over the next 12 hours: Shaking chills. You have a temperature by mouth above 102 F (38.9 C), not controlled by medicine. Chest, back, or muscle pain. People around you feel you are not acting correctly or are confused. Shortness of breath or difficulty breathing. Dizziness and fainting. You get a rash or develop hives. You have a decrease in urine output. Your urine turns a dark color or changes to pink, red, or brown. Any of the following symptoms occur over the next 10 days: You have a temperature by mouth above 102 F (38.9 C), not controlled by medicine. Shortness of breath. Weakness after normal activity. The white part of the eye turns yellow  (jaundice). You have a decrease in the amount of urine or are urinating less often. Your urine turns a dark color or changes to pink, red, or brown. Document Released: 03/22/2000 Document Revised: 06/17/2011 Document Reviewed: 11/09/2007 [facility] Patient Information 2014 [place], [place].  _______________________________________________________________________

## 2022-05-27 ENCOUNTER — Encounter (HOSPITAL_COMMUNITY)
Admission: RE | Admit: 2022-05-27 | Discharge: 2022-05-27 | Disposition: A | Discharge status: Home / Self Care | Payer: Managed Care, Other (non HMO) | Source: Ambulatory Visit | Attending: Anesthesiology | Admitting: Anesthesiology

## 2022-05-28 ENCOUNTER — Encounter: Payer: Self-pay | Admitting: Gynecologic Oncology

## 2022-05-28 NOTE — Progress Notes (Signed)
COVID Vaccine received:  []$  No [x]$  Yes Date of any COVID positive Test in last 90 days:  PCP - Lars Mage , NP [phone]  Fax) [phone] Cardiologist - Chrissie Noa [facility]ineheart ([place])  Chest x-ray - CT chest 06-09-2019  Epic Cardiac CT score- 19    [facility] 12-09-18 CE EKG -  will get at PST  d/t abn cardiac score Stress Test -  ECHO -  Cardiac Cath -   PCR screen: []$  Ordered & Completed                      [x]$   No Order but Needs PROFEND                      []$   N/A for this surgery  Surgery Plan:  [x]$  Ambulatory                            []$  Outpatient in bed                            []$  Admit  Anesthesia:    [x]$  General  []$  Spinal                           []$   Choice []$   MAC  Bowel Prep - []$  No  []$   Yes __Gyn preop diet___________  Pacemaker / ICD device [x]$  No []$  Yes        Device order form faxed [x]$  No    []$   Yes      Faxed to:  Spinal Cord Stimulator:[x]$  No []$  Yes      (Remind patient to bring remote DOS) Other Implants:   History of Sleep Apnea? [x]$  No []$  Yes   CPAP used?- [x]$  No []$  Yes    Does the patient monitor blood sugar? []$  No []$  Yes  [x]$  N/A  Blood Thinner / Instructions: none Aspirin Instructions:  none  ERAS Protocol Ordered: []$  No  [x]$  Yes [x]$  No Drink Ordered Patient is to be NPO after: 04:30 am  Comments:   Activity level: Patient can / can not climb a flight of stairs without difficulty; []$  No CP  []$  No SOB, but would have ______   Patient can / can not perform ADLs without assistance.   Anesthesia review: abn cardiac CT score (19) in 2020, no other pertinent hx  Patient denies shortness of breath, fever, cough and chest pain at PAT appointment.  Patient verbalized understanding and agreement to the Pre-Surgical Instructions that were given to them at this PAT appointment. Patient was also educated of the need to review these PAT instructions again prior to his/her surgery.I reviewed the appropriate phone numbers to call if they  have any and questions or concerns.

## 2022-05-28 NOTE — Patient Instructions (Signed)
SURGICAL WAITING ROOM VISITATION Patients having surgery or a procedure may have no more than 2 support people in the waiting area - these visitors may rotate.     If the patient needs to stay at the hospital during part of their recovery, the visitor guidelines for inpatient rooms apply. Pre-op nurse will coordinate an appropriate time for 1 support person to accompany patient in pre-op.  This support person may not rotate.    Please refer to the [facility] website for the visitor guidelines for Inpatients (after your surgery is over and you are in a regular room).    Due to an increase in RSV and influenza rates and associated hospitalizations, children ages 2 and under may not visit patients in [place].                 Your procedure is scheduled on:  Thursday  06-06-22               Report to [facility] Main Entrance               Report to admitting at 5:15 AM               Call this number if you have problems the morning of surgery [phone]              Eat a light diet the day before surgery.  Examples including soups, broths, toast, yogurt, mashed potatoes.  AVOID GAS PRODUCING FOODS. Things to avoid include carbonated beverages (fizzy beverages, sodas), raw fruits and raw vegetables (uncooked), or beans.               Do not eat food :After Midnight.               After Midnight you may have the following liquids until 4:30 AM DAY OF SURGERY   Water Non-Citrus Juices (without pulp, NO RED) such as apple juice, white grape and white cranberry juice Black Coffee (NO MILK/CREAM OR CREAMERS, sugar ok)  Clear Tea (NO MILK/CREAM OR CREAMERS, sugar ok) regular and decaf                             Plain Jell-O (NO RED)                                           Fruit ices (not with fruit pulp, NO RED)                                     Popsicles (NO RED)                                                               Sports drinks like Gatorade and  Powerade (NO RED)                      If you have questions, please contact your surgeon's office.     FOLLOW BOWEL PREP AND ANY ADDITIONAL PRE OP INSTRUCTIONS YOU RECEIVED FROM YOUR  SURGEON'S OFFICE!!!     Oral Hygiene is also important to reduce your risk of infection.                                    Remember - BRUSH YOUR TEETH THE MORNING OF SURGERY WITH YOUR REGULAR TOOTHPASTE                 Take these medicines the morning of surgery with A SIP OF WATER: none             Okay to use eyedrops                              You may not have any metal on your body including hair pins, jewelry, and body piercing              Do not wear make-up, lotions, powders, perfumes or deodorant   Do not wear nail polish including gel and S&S, artificial/acrylic nails, or any other type of covering on natural nails including finger and toenails. If you have artificial nails, gel coating, etc. that needs to be removed by a nail salon please have this removed prior to surgery or surgery may need to be canceled/ delayed if the surgeon/ anesthesia feels like they are unable to be safely monitored.    Do not shave  48 hours prior to surgery.                Do not bring valuables to the hospital. [place].               Contacts, dentures or bridgework may not be worn into surgery.               DO NOT [place]. PHARMACY WILL DISPENSE MEDICATIONS LISTED ON YOUR MEDICATION LIST TO YOU DURING YOUR ADMISSION [place]!                          Patients discharged on the day of surgery will not be allowed to drive home.  Someone NEEDS to stay with you for the first 24 hours after anesthesia.               Please read over the following fact sheets you were given: IF YOU HAVE QUESTIONS ABOUT YOUR PRE-OP INSTRUCTIONS PLEASE CALL [phone]     [facility] - Preparing for Surgery Before surgery, you can play an important  role.  Because skin is not sterile, your skin needs to be as free of germs as possible.  You can reduce the number of germs on your skin by washing with CHG (chlorahexidine gluconate) soap before surgery.  CHG is an antiseptic cleaner which kills germs and bonds with the skin to continue killing germs even after washing. Please DO NOT use if you have an allergy to CHG or antibacterial soaps.  If your skin becomes reddened/irritated stop using the CHG and inform your nurse when you arrive at Short Stay. Do not shave (including legs and underarms) for at least 48 hours prior to the first CHG shower.  You may shave your face/neck.   Please follow these instructions carefully:             1.  Shower  with CHG Soap the night before surgery and the  morning of surgery.             2.  If you choose to wash your hair, wash your hair first as usual with your normal  shampoo.             3.  After you shampoo, rinse your hair and body thoroughly to remove the shampoo.                                           4.  Use CHG as you would any other liquid soap.  You can apply chg directly to the skin and wash.  Gently with a scrungie or clean washcloth.             5.  Apply the CHG Soap to your body ONLY FROM THE NECK DOWN.   Do not use on face/ open                           Wound or open sores. Avoid contact with eyes, ears mouth and genitals (private parts).                       Wash face,  Genitals (private parts) with your normal soap.             6.  Wash thoroughly, paying special attention to the area where your surgery  will be performed.             7.  Thoroughly rinse your body with warm water from the neck down.             8.  DO NOT shower/wash with your normal soap after using and rinsing off the CHG Soap.             9.  Pat yourself dry with a clean towel.            10.  Wear clean pajamas.            11.  Place clean sheets on your bed the night of your first shower and do not  sleep with  pets.  Day of Surgery : Do not apply any lotions/deodorants the morning of surgery.  Please wear clean clothes to the hospital/surgery center.   FAILURE TO FOLLOW THESE INSTRUCTIONS MAY RESULT IN THE CANCELLATION OF YOUR SURGERY   PATIENT SIGNATURE_________________________________   NURSE SIGNATURE__________________________________   ________________________________________________________________________       WHAT IS A BLOOD TRANSFUSION? Blood Transfusion Information   A transfusion is the replacement of blood or some of its parts. Blood is made up of multiple cells which provide different functions. Red blood cells carry oxygen and are used for blood loss replacement. White blood cells fight against infection. Platelets control bleeding. Plasma helps clot blood. Other blood products are available for specialized needs, such as hemophilia or other clotting disorders. BEFORE THE TRANSFUSION  Who gives blood for transfusions?  Healthy volunteers who are fully evaluated to make sure their blood is safe. This is blood bank blood. Transfusion therapy is the safest it has ever been in the practice of medicine. Before blood is taken from a donor, a complete history is taken to make sure that person has no history of diseases nor engages in risky social  behavior (examples are intravenous drug use or sexual activity with multiple partners). The donor's travel history is screened to minimize risk of transmitting infections, such as malaria. The donated blood is tested for signs of infectious diseases, such as HIV and hepatitis. The blood is then tested to be sure it is compatible with you in order to minimize the chance of a transfusion reaction. If you or a relative donates blood, this is often done in anticipation of surgery and is not appropriate for emergency situations. It takes many days to process the donated blood. RISKS AND COMPLICATIONS Although transfusion therapy is very safe and  saves many lives, the main dangers of transfusion include:  Getting an infectious disease. Developing a transfusion reaction. This is an allergic reaction to something in the blood you were given. Every precaution is taken to prevent this. The decision to have a blood transfusion has been considered carefully by your caregiver before blood is given. Blood is not given unless the benefits outweigh the risks. AFTER THE TRANSFUSION Right after receiving a blood transfusion, you will usually feel much better and more energetic. This is especially true if your red blood cells have gotten low (anemic). The transfusion raises the level of the red blood cells which carry oxygen, and this usually causes an energy increase. The nurse administering the transfusion will monitor you carefully for complications. HOME CARE INSTRUCTIONS  No special instructions are needed after a transfusion. You may find your energy is better. Speak with your caregiver about any limitations on activity for underlying diseases you may have. SEEK MEDICAL CARE IF:  Your condition is not improving after your transfusion. You develop redness or irritation at the intravenous (IV) site. SEEK IMMEDIATE MEDICAL CARE IF:  Any of the following symptoms occur over the next 12 hours: Shaking chills. You have a temperature by mouth above 102 F (38.9 C), not controlled by medicine. Chest, back, or muscle pain. People around you feel you are not acting correctly or are confused. Shortness of breath or difficulty breathing. Dizziness and fainting. You get a rash or develop hives. You have a decrease in urine output. Your urine turns a dark color or changes to pink, red, or brown. Any of the following symptoms occur over the next 10 days: You have a temperature by mouth above 102 F (38.9 C), not controlled by medicine. Shortness of breath. Weakness after normal activity. The white part of the eye turns yellow (jaundice). You have a  decrease in the amount of urine or are urinating less often. Your urine turns a dark color or changes to pink, red, or brown. Document Released: 03/22/2000 Document Revised: 06/17/2011 Document Reviewed: 11/09/2007 [facility] Patient Information 2014 [place].

## 2022-05-29 ENCOUNTER — Encounter (HOSPITAL_COMMUNITY)
Admission: RE | Admit: 2022-05-29 | Discharge: 2022-05-29 | Disposition: A | Discharge status: Home / Self Care | Payer: Managed Care, Other (non HMO) | Source: Ambulatory Visit | Attending: Gynecologic Oncology | Admitting: Gynecologic Oncology

## 2022-05-29 ENCOUNTER — Encounter (HOSPITAL_COMMUNITY): Payer: Self-pay

## 2022-05-29 ENCOUNTER — Other Ambulatory Visit: Payer: Self-pay

## 2022-05-29 VITALS — BP 148/84 | HR 74 | Temp 98.6°F | Resp 14 | Ht 66.5 in | Wt 141.0 lb

## 2022-05-29 DIAGNOSIS — R931 Abnormal findings on diagnostic imaging of heart and coronary circulation: Secondary | ICD-10-CM | POA: Diagnosis not present

## 2022-05-29 DIAGNOSIS — R9389 Abnormal findings on diagnostic imaging of other specified body structures: Secondary | ICD-10-CM | POA: Diagnosis not present

## 2022-05-29 DIAGNOSIS — Z01818 Encounter for other preprocedural examination: Secondary | ICD-10-CM | POA: Insufficient documentation

## 2022-05-29 DIAGNOSIS — N838 Other noninflammatory disorders of ovary, fallopian tube and broad ligament: Secondary | ICD-10-CM | POA: Insufficient documentation

## 2022-05-29 HISTORY — DX: Unspecified osteoarthritis, unspecified site: M19.90

## 2022-05-29 LAB — CBC
HCT: 44.7 % (ref 36.0–46.0)
Hemoglobin: 14.7 g/dL (ref 12.0–15.0)
MCH: 30.2 pg (ref 26.0–34.0)
MCHC: 32.9 g/dL (ref 30.0–36.0)
MCV: 92 fL (ref 80.0–100.0)
Platelets: 268 10*3/uL (ref 150–400)
RBC: 4.86 MIL/uL (ref 3.87–5.11)
RDW: 12.1 % (ref 11.5–15.5)
WBC: 5.8 10*3/uL (ref 4.0–10.5)
nRBC: 0 % (ref 0.0–0.2)

## 2022-05-29 LAB — COMPREHENSIVE METABOLIC PANEL
ALT: 11 U/L (ref 0–44)
AST: 19 U/L (ref 15–41)
Albumin: 4 g/dL (ref 3.5–5.0)
Alkaline Phosphatase: 75 U/L (ref 38–126)
Anion gap: 8 (ref 5–15)
BUN: 15 mg/dL (ref 8–23)
CO2: 27 mmol/L (ref 22–32)
Calcium: 9.1 mg/dL (ref 8.9–10.3)
Chloride: 104 mmol/L (ref 98–111)
Creatinine, Ser: 0.99 mg/dL (ref 0.44–1.00)
GFR, Estimated: >60 mL/min (ref >60)
Glucose, Bld: 89 mg/dL (ref 70–99)
Potassium: 3.9 mmol/L (ref 3.5–5.1)
Sodium: 139 mmol/L (ref 135–145)
Total Bilirubin: 0.6 mg/dL (ref 0.3–1.2)
Total Protein: 6.8 g/dL (ref 6.5–8.1)

## 2022-06-04 ENCOUNTER — Ambulatory Visit: Payer: Self-pay | Admitting: Gastroenterology

## 2022-06-05 ENCOUNTER — Telehealth: Payer: Self-pay

## 2022-06-05 NOTE — Telephone Encounter (Signed)
Telephone call to check on pre-operative status.  Patient compliant with pre-operative instructions.  Reinforced nothing to eat after midnight. Clear liquids until 0430. Patient to arrive at  0515.  No questions or concerns voiced.  Instructed to call for any needs. 

## 2022-06-06 ENCOUNTER — Encounter (HOSPITAL_COMMUNITY): Payer: Self-pay | Admitting: Gynecologic Oncology

## 2022-06-06 ENCOUNTER — Encounter (HOSPITAL_COMMUNITY)
Admission: RE | Disposition: A | Discharge status: Home / Self Care | Payer: Self-pay | Source: Ambulatory Visit | Attending: Gynecologic Oncology

## 2022-06-06 ENCOUNTER — Ambulatory Visit (HOSPITAL_COMMUNITY)
Admission: RE | Admit: 2022-06-06 | Discharge: 2022-06-06 | Disposition: A | Discharge status: Home / Self Care | Payer: Managed Care, Other (non HMO) | Source: Ambulatory Visit | Attending: Gynecologic Oncology | Admitting: Gynecologic Oncology

## 2022-06-06 ENCOUNTER — Other Ambulatory Visit: Payer: Self-pay

## 2022-06-06 ENCOUNTER — Ambulatory Visit (HOSPITAL_COMMUNITY): Payer: Managed Care, Other (non HMO)

## 2022-06-06 ENCOUNTER — Ambulatory Visit (HOSPITAL_BASED_OUTPATIENT_CLINIC_OR_DEPARTMENT_OTHER): Payer: Managed Care, Other (non HMO)

## 2022-06-06 DIAGNOSIS — Z09 Encounter for follow-up examination after completed treatment for conditions other than malignant neoplasm: Secondary | ICD-10-CM | POA: Diagnosis not present

## 2022-06-06 DIAGNOSIS — R931 Abnormal findings on diagnostic imaging of heart and coronary circulation: Secondary | ICD-10-CM

## 2022-06-06 DIAGNOSIS — N840 Polyp of corpus uteri: Secondary | ICD-10-CM

## 2022-06-06 DIAGNOSIS — N838 Other noninflammatory disorders of ovary, fallopian tube and broad ligament: Secondary | ICD-10-CM

## 2022-06-06 DIAGNOSIS — Z79890 Hormone replacement therapy (postmenopausal): Secondary | ICD-10-CM | POA: Diagnosis not present

## 2022-06-06 DIAGNOSIS — K589 Irritable bowel syndrome without diarrhea: Secondary | ICD-10-CM | POA: Diagnosis not present

## 2022-06-06 DIAGNOSIS — N950 Postmenopausal bleeding: Secondary | ICD-10-CM | POA: Diagnosis not present

## 2022-06-06 DIAGNOSIS — D251 Intramural leiomyoma of uterus: Secondary | ICD-10-CM | POA: Diagnosis not present

## 2022-06-06 DIAGNOSIS — N951 Menopausal and female climacteric states: Secondary | ICD-10-CM | POA: Insufficient documentation

## 2022-06-06 DIAGNOSIS — N72 Inflammatory disease of cervix uteri: Secondary | ICD-10-CM | POA: Diagnosis not present

## 2022-06-06 DIAGNOSIS — Z87891 Personal history of nicotine dependence: Secondary | ICD-10-CM | POA: Insufficient documentation

## 2022-06-06 DIAGNOSIS — N839 Noninflammatory disorder of ovary, fallopian tube and broad ligament, unspecified: Secondary | ICD-10-CM | POA: Insufficient documentation

## 2022-06-06 DIAGNOSIS — R9389 Abnormal findings on diagnostic imaging of other specified body structures: Secondary | ICD-10-CM

## 2022-06-06 HISTORY — PX: ROBOTIC ASSISTED TOTAL HYSTERECTOMY WITH BILATERAL SALPINGO OOPHERECTOMY: SHX6086

## 2022-06-06 LAB — ABO/RH: ABO/RH(D): B POS

## 2022-06-06 LAB — TYPE AND SCREEN
ABO/RH(D): B POS
Antibody Screen: NEGATIVE

## 2022-06-06 SURGERY — HYSTERECTOMY, TOTAL, ROBOT-ASSISTED, LAPAROSCOPIC, WITH BILATERAL SALPINGO-OOPHORECTOMY
Anesthesia: General

## 2022-06-06 MED ORDER — FENTANYL CITRATE (PF) 100 MCG/2ML IJ SOLN
INTRAMUSCULAR | Status: DC | PRN
  Administered 2022-06-06 (×2): 50 ug via INTRAVENOUS

## 2022-06-06 MED ORDER — ONDANSETRON HCL 4 MG/2ML IJ SOLN
4.0000 mg | Freq: Four times a day (QID) | INTRAMUSCULAR | Status: DC | PRN
  Administered 2022-06-06: 4 mg via INTRAVENOUS

## 2022-06-06 MED ORDER — ONDANSETRON HCL 4 MG PO TABS: 4.0000 mg | ORAL_TABLET | Freq: Four times a day (QID) | ORAL | Status: DC | PRN

## 2022-06-06 MED ORDER — KETAMINE HCL 50 MG/5ML IJ SOSY
PREFILLED_SYRINGE | INTRAMUSCULAR | Status: AC
  Filled 2022-06-06: qty 5

## 2022-06-06 MED ORDER — LIDOCAINE HCL (PF) 2 % IJ SOLN
INTRAMUSCULAR | Status: AC
  Filled 2022-06-06: qty 10

## 2022-06-06 MED ORDER — MIDAZOLAM HCL 2 MG/2ML IJ SOLN
INTRAMUSCULAR | Status: AC
  Filled 2022-06-06: qty 2

## 2022-06-06 MED ORDER — PROPOFOL 10 MG/ML IV BOLUS
INTRAVENOUS | Status: DC | PRN
  Administered 2022-06-06: 20 mg via INTRAVENOUS
  Administered 2022-06-06: 120 mg via INTRAVENOUS

## 2022-06-06 MED ORDER — ACETAMINOPHEN 500 MG PO TABS: 1000.0000 mg | ORAL_TABLET | ORAL | Status: DC

## 2022-06-06 MED ORDER — DEXAMETHASONE SODIUM PHOSPHATE 10 MG/ML IJ SOLN
INTRAMUSCULAR | Status: DC | PRN
  Administered 2022-06-06: 10 mg via INTRAVENOUS

## 2022-06-06 MED ORDER — FENTANYL CITRATE PF 50 MCG/ML IJ SOSY: 25.0000 ug | PREFILLED_SYRINGE | INTRAMUSCULAR | Status: DC | PRN

## 2022-06-06 MED ORDER — HEPARIN SODIUM (PORCINE) 5000 UNIT/ML IJ SOLN
5000.0000 [IU] | INTRAMUSCULAR | Status: AC
  Administered 2022-06-06: 5000 [IU] via SUBCUTANEOUS
  Filled 2022-06-06: qty 1

## 2022-06-06 MED ORDER — ONDANSETRON HCL 4 MG/2ML IJ SOLN
INTRAMUSCULAR | Status: AC
  Filled 2022-06-06: qty 2

## 2022-06-06 MED ORDER — SUGAMMADEX SODIUM 200 MG/2ML IV SOLN
INTRAVENOUS | Status: DC | PRN
  Administered 2022-06-06: 150 mg via INTRAVENOUS

## 2022-06-06 MED ORDER — BUPIVACAINE HCL 0.25 % IJ SOLN
INTRAMUSCULAR | Status: DC | PRN
  Administered 2022-06-06: 30 mL

## 2022-06-06 MED ORDER — DEXAMETHASONE SODIUM PHOSPHATE 4 MG/ML IJ SOLN: 4.0000 mg | INTRAMUSCULAR | Status: DC

## 2022-06-06 MED ORDER — ACETAMINOPHEN 500 MG PO TABS
1000.0000 mg | ORAL_TABLET | Freq: Once | ORAL | Status: AC
  Administered 2022-06-06: 1000 mg via ORAL
  Filled 2022-06-06: qty 2

## 2022-06-06 MED ORDER — CHLORHEXIDINE GLUCONATE 0.12 % MT SOLN
15.0000 mL | Freq: Once | OROMUCOSAL | Status: AC
  Administered 2022-06-06: 15 mL via OROMUCOSAL

## 2022-06-06 MED ORDER — MIDAZOLAM HCL 5 MG/5ML IJ SOLN
INTRAMUSCULAR | Status: DC | PRN
  Administered 2022-06-06: 2 mg via INTRAVENOUS

## 2022-06-06 MED ORDER — LACTATED RINGERS IV SOLN: INTRAVENOUS | Status: DC | PRN

## 2022-06-06 MED ORDER — CEFAZOLIN SODIUM-DEXTROSE 2-4 GM/100ML-% IV SOLN
2.0000 g | INTRAVENOUS | Status: AC
  Administered 2022-06-06: 2 g via INTRAVENOUS
  Filled 2022-06-06: qty 100

## 2022-06-06 MED ORDER — PROPOFOL 10 MG/ML IV BOLUS
INTRAVENOUS | Status: AC
  Filled 2022-06-06: qty 20

## 2022-06-06 MED ORDER — ROCURONIUM BROMIDE 10 MG/ML (PF) SYRINGE
PREFILLED_SYRINGE | INTRAVENOUS | Status: DC | PRN
  Administered 2022-06-06: 50 mg via INTRAVENOUS
  Administered 2022-06-06: 20 mg via INTRAVENOUS

## 2022-06-06 MED ORDER — BUPIVACAINE HCL 0.25 % IJ SOLN
INTRAMUSCULAR | Status: AC
  Filled 2022-06-06: qty 1

## 2022-06-06 MED ORDER — ONDANSETRON HCL 4 MG/2ML IJ SOLN
INTRAMUSCULAR | Status: DC | PRN
  Administered 2022-06-06: 4 mg via INTRAVENOUS

## 2022-06-06 MED ORDER — FENTANYL CITRATE (PF) 250 MCG/5ML IJ SOLN
INTRAMUSCULAR | Status: AC
  Filled 2022-06-06: qty 5

## 2022-06-06 MED ORDER — LACTATED RINGERS IV SOLN: INTRAVENOUS | Status: DC

## 2022-06-06 MED ORDER — ORAL CARE MOUTH RINSE: 15.0000 mL | Freq: Once | OROMUCOSAL | Status: AC

## 2022-06-06 MED ORDER — LIDOCAINE 2% (20 MG/ML) 5 ML SYRINGE
INTRAMUSCULAR | Status: DC | PRN
  Administered 2022-06-06: 1.5 mg/kg/h via INTRAVENOUS
  Administered 2022-06-06: 60 mg via INTRAVENOUS

## 2022-06-06 SURGICAL SUPPLY — 80 items
ADH SKN CLS APL DERMABOND .7 (GAUZE/BANDAGES/DRESSINGS) ×1
AGENT HMST KT MTR STRL THRMB (HEMOSTASIS)
APL ESCP 34 STRL LF DISP (HEMOSTASIS)
APPLICATOR SURGIFLO ENDO (HEMOSTASIS) IMPLANT
BAG COUNTER SPONGE SURGICOUNT (BAG) IMPLANT
BAG LAPAROSCOPIC 12 15 PORT 16 (BASKET) IMPLANT
BAG RETRIEVAL 12/15 (BASKET)
BAG SPNG CNTER NS LX DISP (BAG)
BLADE SURG SZ10 CARB STEEL (BLADE) IMPLANT
COVER BACK TABLE 60X90IN (DRAPES) ×1 IMPLANT
COVER TIP SHEARS 8 DVNC (MISCELLANEOUS) ×1 IMPLANT
COVER TIP SHEARS 8MM DA VINCI (MISCELLANEOUS) ×1
DERMABOND ADVANCED .7 DNX12 (GAUZE/BANDAGES/DRESSINGS) ×1 IMPLANT
DRAPE ARM DVNC X/XI (DISPOSABLE) ×4 IMPLANT
DRAPE COLUMN DVNC XI (DISPOSABLE) ×1 IMPLANT
DRAPE DA VINCI XI ARM (DISPOSABLE) ×4
DRAPE DA VINCI XI COLUMN (DISPOSABLE) ×1
DRAPE SHEET LG 3/4 BI-LAMINATE (DRAPES) ×1 IMPLANT
DRAPE SURG IRRIG POUCH 19X23 (DRAPES) ×1 IMPLANT
DRSG OPSITE POSTOP 4X6 (GAUZE/BANDAGES/DRESSINGS) IMPLANT
DRSG OPSITE POSTOP 4X8 (GAUZE/BANDAGES/DRESSINGS) IMPLANT
ELECT PENCIL ROCKER SW 15FT (MISCELLANEOUS) IMPLANT
ELECT REM PT RETURN 15FT ADLT (MISCELLANEOUS) ×1 IMPLANT
GAUZE 4X4 16PLY ~~LOC~~+RFID DBL (SPONGE) ×2 IMPLANT
GLOVE BIO SURGEON STRL SZ 6 (GLOVE) ×4 IMPLANT
GLOVE BIO SURGEON STRL SZ 6.5 (GLOVE) ×1 IMPLANT
GOWN STRL REUS W/ TWL LRG LVL3 (GOWN DISPOSABLE) ×4 IMPLANT
GOWN STRL REUS W/TWL LRG LVL3 (GOWN DISPOSABLE) ×4
GRASPER SUT TROCAR 14GX15 (MISCELLANEOUS) IMPLANT
HIBICLENS CHG 4% 4OZ BTL (MISCELLANEOUS) ×2 IMPLANT
HOLDER FOLEY CATH W/STRAP (MISCELLANEOUS) IMPLANT
IRRIG SUCT STRYKERFLOW 2 WTIP (MISCELLANEOUS) ×1
IRRIGATION SUCT STRKRFLW 2 WTP (MISCELLANEOUS) ×1 IMPLANT
KIT PROCEDURE DA VINCI SI (MISCELLANEOUS)
KIT PROCEDURE DVNC SI (MISCELLANEOUS) IMPLANT
KIT TURNOVER KIT A (KITS) IMPLANT
LIGASURE IMPACT 36 18CM CVD LR (INSTRUMENTS) IMPLANT
MANIPULATOR ADVINCU DEL 3.0 PL (MISCELLANEOUS) IMPLANT
MANIPULATOR ADVINCU DEL 3.5 PL (MISCELLANEOUS) IMPLANT
MANIPULATOR UTERINE 4.5 ZUMI (MISCELLANEOUS) IMPLANT
NDL HYPO 21X1.5 SAFETY (NEEDLE) ×1 IMPLANT
NDL SPNL 18GX3.5 QUINCKE PK (NEEDLE) IMPLANT
NEEDLE HYPO 21X1.5 SAFETY (NEEDLE) ×1 IMPLANT
NEEDLE SPNL 18GX3.5 QUINCKE PK (NEEDLE) IMPLANT
OBTURATOR OPTICAL STANDARD 8MM (TROCAR) ×1
OBTURATOR OPTICAL STND 8 DVNC (TROCAR) ×1
OBTURATOR OPTICALSTD 8 DVNC (TROCAR) ×1 IMPLANT
PACK ROBOT GYN CUSTOM WL (TRAY / TRAY PROCEDURE) ×1 IMPLANT
PAD POSITIONING PINK XL (MISCELLANEOUS) ×1 IMPLANT
PORT ACCESS TROCAR AIRSEAL 12 (TROCAR) IMPLANT
SCRUB CHG 4% DYNA-HEX 4OZ (MISCELLANEOUS) IMPLANT
SEAL CANN UNIV 5-8 DVNC XI (MISCELLANEOUS) ×3 IMPLANT
SEAL XI 5MM-8MM UNIVERSAL (MISCELLANEOUS) ×4
SET TRI-LUMEN FLTR TB AIRSEAL (TUBING) ×1 IMPLANT
SPIKE FLUID TRANSFER (MISCELLANEOUS) ×1 IMPLANT
SPONGE T-LAP 18X18 ~~LOC~~+RFID (SPONGE) IMPLANT
SURGIFLO W/THROMBIN 8M KIT (HEMOSTASIS) IMPLANT
SUT MNCRL AB 4-0 PS2 18 (SUTURE) IMPLANT
SUT PDS AB 1 TP1 96 (SUTURE) IMPLANT
SUT V-LOC 180 0-0 GS22 (SUTURE) IMPLANT
SUT VIC AB 0 CT1 27 (SUTURE)
SUT VIC AB 0 CT1 27XBRD ANTBC (SUTURE) IMPLANT
SUT VIC AB 2-0 CT1 27 (SUTURE)
SUT VIC AB 2-0 CT1 TAPERPNT 27 (SUTURE) IMPLANT
SUT VIC AB 4-0 PS2 18 (SUTURE) ×2 IMPLANT
SUT VICRYL 0 27 CT2 27 ABS (SUTURE) ×1 IMPLANT
SUT VLOC 180 0 9IN  GS21 (SUTURE)
SUT VLOC 180 0 9IN GS21 (SUTURE) IMPLANT
SYR 10ML LL (SYRINGE) IMPLANT
SYS BAG RETRIEVAL 10MM (BASKET)
SYS WOUND ALEXIS 18CM MED (MISCELLANEOUS)
SYSTEM BAG RETRIEVAL 10MM (BASKET) IMPLANT
SYSTEM WOUND ALEXIS 18CM MED (MISCELLANEOUS) IMPLANT
TOWEL OR NON WOVEN STRL DISP B (DISPOSABLE) IMPLANT
TRAP SPECIMEN MUCUS 40CC (MISCELLANEOUS) IMPLANT
TRAY FOLEY MTR SLVR 16FR STAT (SET/KITS/TRAYS/PACK) ×1 IMPLANT
TROCAR PORT AIRSEAL 5X120 (TROCAR) IMPLANT
UNDERPAD 30X36 HEAVY ABSORB (UNDERPADS AND DIAPERS) ×2 IMPLANT
WATER STERILE IRR 1000ML POUR (IV SOLUTION) ×1 IMPLANT
YANKAUER SUCT BULB TIP 10FT TU (MISCELLANEOUS) IMPLANT

## 2022-06-06 NOTE — Anesthesia Postprocedure Evaluation (Signed)
Anesthesia Post Note  Patient: Jennifer Anderson  Procedure(s) Performed: XI ROBOTIC ASSISTED TOTAL HYSTERECTOMY WITH BILATERAL SALPINGO OOPHORECTOMY     Patient location during evaluation: PACU Anesthesia Type: General Level of consciousness: awake and alert Pain management: pain level controlled Vital Signs Assessment: post-procedure vital signs reviewed and stable Respiratory status: spontaneous breathing, nonlabored ventilation, respiratory function stable and patient connected to nasal cannula oxygen Cardiovascular status: blood pressure returned to baseline and stable Postop Assessment: no apparent nausea or vomiting Anesthetic complications: no  No notable events documented.  Last Vitals:  Vitals:   06/06/22 1515 06/06/22 1545  BP: (!) 170/88 (!) 158/82  Pulse: 76 92  Resp: 19 16  Temp:    SpO2: 96% 92%    Last Pain:  Vitals:   06/06/22 1545  TempSrc:   PainSc: Asleep                 Janellie Tennison L Nicolis Boody

## 2022-06-06 NOTE — Discharge Instructions (Addendum)

## 2022-06-06 NOTE — Brief Op Note (Signed)
06/06/2022  2:10 PM  PATIENT:  Jennifer Anderson  67 y.o. female  PRE-OPERATIVE DIAGNOSIS:  OVARIAN MASS THICKENED ENDOMETRIUM  POST-OPERATIVE DIAGNOSIS:  OVARIAN MASSTHICKENED ENDOMETRIUM  PROCEDURE:  Procedure(s): XI ROBOTIC ASSISTED TOTAL HYSTERECTOMY WITH BILATERAL SALPINGO OOPHORECTOMY (N/A)  SURGEON:  Surgeon(s) and Role:    Lafonda Mosses, MD - Primary  ASSISTANTS: Cross, Melissa   ANESTHESIA:   general  EBL:  75 mL   UOP: 75 cc  BLOOD ADMINISTERED:none  DRAINS: none   LOCAL MEDICATIONS USED:  MARCAINE     SPECIMEN:  uterus, cervix, tubes and ovaries, pelvic washings  DISPOSITION OF SPECIMEN:  PATHOLOGY  COUNTS:  YES  TOURNIQUET:  * No tourniquets in log *  DICTATION: .Note written in EPIC  PLAN OF CARE: Discharge to home after PACU  PATIENT DISPOSITION:  PACU - hemodynamically stable.   Delay start of Pharmacological VTE agent (>24hrs) due to surgical blood loss or risk of bleeding: not applicable

## 2022-06-06 NOTE — Op Note (Signed)
OPERATIVE NOTE  Pre-operative Diagnosis: Adnexal mass, thickened endometrium with scant preoperative biopsy  Post-operative Diagnosis: same, benign appearing right para-ovarian vs tubal 5 cm cyst, benign appearing endometrium and polyp on frozen  Operation: Robotic-assisted laparoscopic total hysterectomy with bilateral salpingo-oophorectomy, cystoscopy  Surgeon: Jeral Pinch MD  Assistant Surgeon: Joylene John NP  Anesthesia: GET  Urine Output: 75 cc  Operative Findings: On EUA, small mobile uterus, no adnexal masses appreciated.  On intra-abdominal entry, normal upper abdominal survey.  Normal omentum, small and large bowel.  Uterus approximately 8 cm and normal in appearance.  4-5 cm paratubal versus paraovarian cyst on the right.  Normal-appearing bilateral ovaries.  Dense adhesions between the bladder and lower uterine segment/cervix.  Cystoscopy performed given difficult dissection with intact bladder dome, good efflux from bilateral ureteral orifices.  No intra-abdominal or pelvic evidence of metastatic disease. Frozen section, benign appearing endometrial polyp.  Estimated Blood Loss:  75 cc      Total IV Fluids: see I&O flowsheet         Specimens: uterus, cervix, bilateral tubes and ovaries, pelvic washings         Complications:  None apparent; patient tolerated the procedure well.         Disposition: PACU - hemodynamically stable.  Procedure Details  The patient was seen in the Holding Room. The risks, benefits, complications, treatment options, and expected outcomes were discussed with the patient.  The patient concurred with the proposed plan, giving informed consent.  The site of surgery properly noted/marked. The patient was identified as Jennifer Anderson and the procedure verified as a Robotic-assisted hysterectomy with bilateral salpingo oophorectomy.   After induction of anesthesia, the patient was draped and prepped in the usual sterile manner. Patient was  placed in supine position after anesthesia and draped and prepped in the usual sterile manner as follows: Her arms were tucked to her side with all appropriate precautions.  The patient was secured to the bed using padding and tape across her chest.  The patient was placed in the semi-lithotomy position in [place].  The perineum and vagina were prepped with Betadine. The patient's abdomen was prepped with ChloraPrep and then she was draped after the prep had been allowed to dry for 3 minutes.  A Time Out was held and the above information confirmed.  The urethra was prepped with Betadine. Foley catheter was placed.  A sterile speculum was placed in the vagina.  The cervix was grasped with a single-tooth tenaculum. The cervix was dilated with Kennon Rounds dilators.  The ZUMI uterine manipulator with a medium colpotomizer ring was placed without difficulty.  A pneum occluder balloon was placed over the manipulator.  OG tube placement was confirmed and to suction.   Next, a 5 mm skin incision was made 1 cm below the subcostal margin in the midclavicular line.  The 5 mm Optiview port and scope was used for direct entry.  Opening pressure was under 10 mm CO2.  The abdomen was insufflated and the findings were noted as above.   At this point and all points during the procedure, the patient's intra-abdominal pressure did not exceed 15 mmHg. Next, an 8 mm skin incision was made superior to the umbilicus and a right and left port were placed about 8 cm lateral to the robot port on the right and left side.  A fourth arm was placed on the right.  The 5 mm assist trocar was exchanged for a 5 mm airseal port. All ports  were placed under direct visualization.  The patient was placed in steep Trendelenburg.  Bowel was folded away into the upper abdomen.  The robot was docked in the normal manner.  The right and left peritoneum were opened parallel to the IP ligament to open the retroperitoneal spaces bilaterally. The round  ligaments were transected. The ureter was noted to be on the medial leaf of the broad ligament.  The peritoneum above the ureter was incised and stretched and the infundibulopelvic ligament was skeletonized, cauterized and cut.    The posterior peritoneum was taken down to the level of the KOH ring.  The anterior peritoneum was also taken down.  The bladder flap was created to the level of the KOH ring.  The uterine artery on the right side was skeletonized, cauterized and cut in the normal manner.  A similar procedure was performed on the left.  The colpotomy was made and the uterus, cervix, bilateral ovaries and tubes were amputated and delivered through the vagina.  Pedicles were inspected and excellent hemostasis was achieved.    The colpotomy at the vaginal cuff was closed with 0 Vicryl on a CT1 needle with a figure of eight stitch at each apex and 0 V-Loc to close the midportion of the cuff in running manner.  Irrigation was used and excellent hemostasis was achieved.  Intra-abdominal pressure was decreased to 5 mm Hg with excellent hemostasis noted.  Cystoscopy was performed after the catheter was removed and the bladder was backfilled with 200 cc of sterile fluid.  Findings noted above.  Bladder was then emptied.  At this point in the procedure was completed.  Robotic instruments were removed under direct visulaization.  The robot was undocked. The fascia at the 10-12 mm port was closed with 0 Vicryl on a UR-5 needle.  The subcuticular tissue was closed with 4-0 Vicryl and the skin was closed with 4-0 Monocryl in a subcuticular manner.  Dermabond was applied.    The vagina was swabbed with minimal bleeding noted. All sponge, lap and needle counts were correct x  3.   The patient was transferred to the recovery room in stable condition.  Jeral Pinch, MD

## 2022-06-06 NOTE — Anesthesia Procedure Notes (Signed)
Procedure Name: Intubation Date/Time: 06/06/2022 12:44 PM  Performed by: Gean Maidens, CRNAPre-anesthesia Checklist: Patient identified, Emergency Drugs available, Suction available, Patient being monitored and Timeout performed Patient Re-evaluated:Patient Re-evaluated prior to induction Oxygen Delivery Method: Circle system utilized Preoxygenation: Pre-oxygenation with 100% oxygen Induction Type: IV induction Ventilation: Mask ventilation without difficulty Laryngoscope Size: Mac and 4 Grade View: Grade I Tube type: Oral Tube size: 7.0 mm Number of attempts: 1 Airway Equipment and Method: Stylet Placement Confirmation: ETT inserted through vocal cords under direct vision, positive ETCO2 and breath sounds checked- equal and bilateral Secured at: 21 cm Tube secured with: Tape Dental Injury: Teeth and Oropharynx as per pre-operative assessment  Comments: Performed by srna

## 2022-06-06 NOTE — Interval H&P Note (Signed)
History and Physical Interval Note:  06/06/2022 10:17 AM  Jennifer Anderson  has presented today for surgery, with the diagnosis of OVARIAN MASS THICKENED ENDOMETRIUM.  The various methods of treatment have been discussed with the patient and family. After consideration of risks, benefits and other options for treatment, the patient has consented to  Procedure(s): XI ROBOTIC ASSISTED TOTAL HYSTERECTOMY WITH BILATERAL SALPINGO OOPHORECTOMY;POSSIBLE STAGING (N/A) as a surgical intervention.  The patient's history has been reviewed, patient examined, no change in status, stable for surgery.  I have reviewed the patient's chart and labs.  Questions were answered to the patient's satisfaction.     Lafonda Mosses

## 2022-06-06 NOTE — Transfer of Care (Signed)
Immediate Anesthesia Transfer of Care Note  Patient: Jennifer Anderson  Procedure(s) Performed: XI ROBOTIC ASSISTED TOTAL HYSTERECTOMY WITH BILATERAL SALPINGO OOPHORECTOMY  Patient Location: PACU  Anesthesia Type:General  Level of Consciousness: awake, alert , and oriented  Airway & Oxygen Therapy: Patient Spontanous Breathing and Patient connected to face mask oxygen  Post-op Assessment: Report given to RN and Post -op Vital signs reviewed and stable  Post vital signs: Reviewed and stable  Last Vitals:  Vitals Value Taken Time  BP 131/107 06/06/22 1436  Temp    Pulse    Resp 15 06/06/22 1441  SpO2    Vitals shown include unvalidated device data.  Last Pain:  Vitals:   06/06/22 1103  TempSrc:   PainSc: 0-No pain         Complications: No notable events documented.

## 2022-06-06 NOTE — Anesthesia Preprocedure Evaluation (Addendum)
Anesthesia Evaluation  Patient identified by MRN, date of birth, ID band Patient awake    Reviewed: Patient's Chart, lab work & pertinent test results  Airway Mallampati: I  TM Distance: >3 FB Neck ROM: Full    Dental  (+) Chipped, Dental Advisory Given,    Pulmonary former smoker   Pulmonary exam normal breath sounds clear to auscultation       Cardiovascular negative cardio ROS Normal cardiovascular exam Rhythm:Regular Rate:Normal     Neuro/Psych negative neurological ROS  negative psych ROS   GI/Hepatic negative GI ROS, Neg liver ROS,,,  Endo/Other  negative endocrine ROS    Renal/GU negative Renal ROS  negative genitourinary   Musculoskeletal  (+) Arthritis ,    Abdominal   Peds  Hematology negative hematology ROS (+)   Anesthesia Other Findings   Reproductive/Obstetrics                             Anesthesia Physical Anesthesia Plan  ASA: 2  Anesthesia Plan: General   Post-op Pain Management: Ketamine IV*, Tylenol PO (pre-op)* and Lidocaine infusion*   Induction: Intravenous  PONV Risk Score and Plan: 3 and Midazolam, Dexamethasone and Ondansetron  Airway Management Planned: Oral ETT  Additional Equipment:   Intra-op Plan:   Post-operative Plan: Extubation in OR  Informed Consent: I have reviewed the patients History and Physical, chart, labs and discussed the procedure including the risks, benefits and alternatives for the proposed anesthesia with the patient or authorized representative who has indicated his/her understanding and acceptance.     Dental advisory given  Plan Discussed with: CRNA  Anesthesia Plan Comments: (2 IVs)       Anesthesia Quick Evaluation

## 2022-06-07 ENCOUNTER — Telehealth: Payer: Self-pay

## 2022-06-07 ENCOUNTER — Encounter (HOSPITAL_COMMUNITY): Payer: Self-pay | Admitting: Gynecologic Oncology

## 2022-06-07 NOTE — Telephone Encounter (Signed)
Spoke with Jennifer Anderson this morning. She states she is eating, drinking and urinating well. She has not had a BM yet but is passing gas. She is taking senokot as prescribed and encouraged her to drink plenty of water. She denies fever or chills. Incisions are dry and intact. She rates her pain 3/10. Her pain is controlled with Ibuprofen    Instructed to call office with any fever, chills, purulent drainage, uncontrolled pain or any other questions or concerns. Patient verbalizes understanding.   Pt aware of post op appointments as well as the office number [phone] and after hours number [phone] to call if she has any questions or concerns

## 2022-06-10 ENCOUNTER — Telehealth: Payer: Self-pay | Admitting: Surgery

## 2022-06-10 LAB — SURGICAL PATHOLOGY

## 2022-06-10 LAB — CYTOLOGY - NON PAP

## 2022-06-10 NOTE — Telephone Encounter (Signed)
LVM for patient to call back regarding pathology results.

## 2022-06-11 ENCOUNTER — Ambulatory Visit: Payer: Self-pay | Admitting: Gastroenterology

## 2022-06-13 ENCOUNTER — Telehealth: Payer: Self-pay | Admitting: *Deleted

## 2022-06-13 NOTE — Telephone Encounter (Signed)
Spoke with the patient and moved her appt from 3/22 to 3/19

## 2022-06-24 NOTE — Progress Notes (Unsigned)
Gynecologic Oncology Post-op Follow Up  HPI: Jennifer Anderson is a 67 y.o. female who was seen in consultation at the request of Victorino December [facility] for evaluation of an adnexal mass and thickened endometrium.   She presented initially with postmenopausal bleeding in the setting of being on hormone replacement therapy for approximately 15 years with Prempro.  HRT is in the setting of significant menopausal symptoms including general malaise, poor sleep, hot flashes, and weight gain.   Pap test was done with her primary care provider - AGUS, HR HPV negative.    Pelvic ultrasound exam was performed on 04/29/2022 with a uterus measuring 8.7 x 4.2 x 6.4 cm.  Small posterior wall intramural leiomyoma noted.  Endometrium measures 14 mm, focally thickened and heterogenous at upper uterine segment.  Right ovary measures 8.3 x 4.2 x 5.1 cm.  Complex cystic lesion with heterogenous internal echogenicity measures 5.7 x 3.7 x 4.1 cm, cystic mass versus hemorrhagic cyst.  Left ovary normal in size and appearance.  No free fluid noted.   CA-125 on 1/24 was normal (18). EMB on 05/01/22 revealed sparse small fragments of benign endometrium and benign endocervical and squamous mucosa.   She is s/p robotic-assisted laparoscopic total hysterectomy with bilateral salpingo-oophorectomy, cystoscopy with Dr. Jeral Pinch on 06/06/22. Final pathology revealed: A. UTERUS, CERVIX, BILATERAL FALLOPIAN TUBES AND OVARIES:  Benign endometrial polyp measuring 2.2 x 2.0 x 0.9 cm  Simple hyperplasia without atypia  Benign leiomyomata, intramural, measuring up to 1.2 cm in greatest dimension  Chronic cervicitis with squamous metaplasia  Benign fallopian tubes and ovaries  Negative for malignancy   WASHINGS FINAL MICROSCOPIC DIAGNOSIS:  - No malignant cells identified  - Benign mesothelial cells with scattered mixed inflammatory cells   Interval: She presents today for post-operative follow up. She reports overall doing  well. Tolerating her diet with no nausea or emesis. She has had abdominal swelling/bloating and pelvic pressure. She has a history of IBS in form of constipation and she is continuing to take senna. Feels she is emptying her bladder when voiding. No vaginal discharge or bleeding reported. No pain reported at this time. Had moderate discomfort in bilateral shoulders postop that resolved over time. Has been hard for her with the activity restrictions esp given having horses.   Exam: General: Well developed, well nourished female in no acute distress. Alert and oriented x 3.  Cardiovascular: Regular rate and rhythm. S1 and S2 normal.  Lungs: Clear to auscultation bilaterally. No wheezes/crackles/rhonchi noted.  Skin: No rashes or lesions present. Back: No CVA tenderness.  Abdomen: Abdomen soft, non-tender and non-obese. Mildly distended, tympanic on percussion, active bowel sounds. Incisions are healing well without erythema or drainage.  Genitourinary:    Vulva/vagina: Normal external female genitalia. No lesions.    Urethra: No lesions or masses    Vagina: Vaginal cuff intact, suture visible.  Bimanual exam reveals intact, no fluctuance or tenderness with palpation. No lesions. No palpable masses. No vaginal bleeding or drainage noted.  Extremities: No bilateral cyanosis, edema, or clubbing.   Assessment/Plan: Jennifer Anderson is a 67 year old female s/p robotic-assisted laparoscopic total hysterectomy with bilateral salpingo-oophorectomy, cystoscopy with Dr. Jeral Pinch on 06/06/22 with benign findings on final path. She is doing well post-operatively and meeting expected milestones. She is advised to continue with post-operative restrictions including no heavy lifting, strenuous activity for the full six weeks from surgery. Given pressure symptoms, plan to check a urinalysis to rule out infection. Discussed use of supportive  garment for the abdomen. Abd binder given.  No follow up with GYN ONC is  necessary at this time based on final pathology unless needs arise in the future. She is advised to call for any needs or concerns.

## 2022-06-25 ENCOUNTER — Telehealth: Payer: Self-pay

## 2022-06-25 ENCOUNTER — Other Ambulatory Visit: Payer: Self-pay

## 2022-06-25 ENCOUNTER — Inpatient Hospital Stay: Payer: Managed Care, Other (non HMO) | Attending: Gynecologic Oncology | Admitting: Gynecologic Oncology

## 2022-06-25 ENCOUNTER — Inpatient Hospital Stay: Payer: Managed Care, Other (non HMO)

## 2022-06-25 VITALS — BP 154/81 | HR 73 | Temp 98.4°F | Resp 14 | Ht 66.5 in | Wt 141.0 lb

## 2022-06-25 DIAGNOSIS — R5381 Other malaise: Secondary | ICD-10-CM | POA: Insufficient documentation

## 2022-06-25 DIAGNOSIS — Z9079 Acquired absence of other genital organ(s): Secondary | ICD-10-CM | POA: Diagnosis not present

## 2022-06-25 DIAGNOSIS — D3911 Neoplasm of uncertain behavior of right ovary: Secondary | ICD-10-CM | POA: Diagnosis present

## 2022-06-25 DIAGNOSIS — R232 Flushing: Secondary | ICD-10-CM | POA: Diagnosis not present

## 2022-06-25 DIAGNOSIS — N949 Unspecified condition associated with female genital organs and menstrual cycle: Secondary | ICD-10-CM

## 2022-06-25 DIAGNOSIS — R3989 Other symptoms and signs involving the genitourinary system: Secondary | ICD-10-CM

## 2022-06-25 DIAGNOSIS — Z90710 Acquired absence of both cervix and uterus: Secondary | ICD-10-CM | POA: Diagnosis not present

## 2022-06-25 DIAGNOSIS — R9389 Abnormal findings on diagnostic imaging of other specified body structures: Secondary | ICD-10-CM | POA: Insufficient documentation

## 2022-06-25 DIAGNOSIS — Z90722 Acquired absence of ovaries, bilateral: Secondary | ICD-10-CM | POA: Diagnosis not present

## 2022-06-25 DIAGNOSIS — N838 Other noninflammatory disorders of ovary, fallopian tube and broad ligament: Secondary | ICD-10-CM

## 2022-06-25 LAB — URINALYSIS, COMPLETE (UACMP) WITH MICROSCOPIC
Bilirubin Urine: NEGATIVE
Glucose, UA: NEGATIVE mg/dL
Hgb urine dipstick: NEGATIVE
Ketones, ur: NEGATIVE mg/dL
Leukocytes,Ua: NEGATIVE
Nitrite: NEGATIVE
Protein, ur: NEGATIVE mg/dL
Specific Gravity, Urine: 1.01 (ref 1.005–1.030)
pH: 7 (ref 5.0–8.0)

## 2022-06-25 NOTE — Telephone Encounter (Signed)
Per Joylene John NP, pt is aware, via voicemail, of urinalysis being negative for infection but showing a few bacteria. Culture sent.  Will call pt with results of culture once received

## 2022-06-25 NOTE — Patient Instructions (Addendum)
You are healing well from surgery. Continue with your post-operative restrictions of no heavy lifting or strenuous activity for six weeks from surgery. Nothing in the vagina for 10 weeks minimum from surgery.  We will send the urine for analysis today to rule out infection.   You can try wearing a support garment that offers even pressure to your abdomen to offer support and light compression with the swelling you are expereincing. This should improve over the next month.  Please call our office for any needs, concerns, or persistent symptoms.  No follow up necessary with GYN Oncology unless needs arise in the future. Please call for any questions or concerns.  You are released back to the care of your PCP and GYN provider for your well woman care.

## 2022-06-26 LAB — URINE CULTURE: Culture: NO GROWTH

## 2022-06-27 ENCOUNTER — Telehealth: Payer: Self-pay

## 2022-06-27 NOTE — Telephone Encounter (Signed)
-----   Message from Dorothyann Gibbs, NP sent at 06/27/2022 10:55 AM EDT ----- No evidence of infection on culture. Please check in to see how she is feeling ----- Message ----- From: Buel Ream, Lab In [place] Sent: 06/26/2022   5:16 PM EDT To: Dorothyann Gibbs, NP

## 2022-06-27 NOTE — Telephone Encounter (Signed)
LVM for patient to call office regarding urine culture results 

## 2022-06-28 ENCOUNTER — Encounter: Payer: Self-pay | Admitting: Gynecologic Oncology

## 2022-06-28 ENCOUNTER — Encounter: Payer: Managed Care, Other (non HMO) | Admitting: Gynecologic Oncology

## 2022-06-28 NOTE — Telephone Encounter (Signed)
LVM for patient to call office in response to MyChart message regarding urine culture results.   MyChart message sent

## 2022-06-28 NOTE — Telephone Encounter (Signed)
Patient returned call. Reviewed urine culture and UA results with patients and let her know results show no signs of infection. Patient denies any pain, pressure, burning with urination, frequency, or urgency. Patient verbalized understanding of urine results and had no other concerns at this time.

## 2022-06-28 NOTE — Telephone Encounter (Signed)
LVM for patient to call office regarding negative urine culture. How is she feeling?

## 2022-08-12 ENCOUNTER — Ambulatory Visit: Payer: Managed Care, Other (non HMO) | Admitting: Gastroenterology

## 2022-11-05 ENCOUNTER — Ambulatory Visit: Payer: Managed Care, Other (non HMO) | Admitting: Gastroenterology

## 2023-11-23 LAB — COLOGUARD: COLOGUARD: NEGATIVE

## 2024-03-10 ENCOUNTER — Ambulatory Visit: Attending: Cardiology | Admitting: Cardiology

## 2024-03-10 ENCOUNTER — Encounter: Payer: Self-pay | Admitting: Cardiology

## 2024-03-10 VITALS — BP 148/82 | HR 74 | Ht 66.5 in | Wt 144.6 lb

## 2024-03-10 DIAGNOSIS — I10 Essential (primary) hypertension: Secondary | ICD-10-CM | POA: Diagnosis not present

## 2024-03-10 DIAGNOSIS — R931 Abnormal findings on diagnostic imaging of heart and coronary circulation: Secondary | ICD-10-CM

## 2024-03-10 MED ORDER — PRAVASTATIN SODIUM 20 MG PO TABS: 20.0000 mg | ORAL_TABLET | Freq: Every evening | ORAL | 6 refills | Status: AC

## 2024-03-10 MED ORDER — ASPIRIN 81 MG PO TBEC: 81.0000 mg | DELAYED_RELEASE_TABLET | Freq: Every day | ORAL | Status: AC

## 2024-03-10 NOTE — Patient Instructions (Addendum)
 Medication Instructions:   Begin Pravastatin 20mg  daily  Begin Aspirin 81mg  daily  Continue all other medications.     Labwork:  none  Testing/Procedures:  none  Follow-Up:  6 months   Any Other Special Instructions Will Be Listed Below (If Applicable).  DASH diet info given  Update office on your BP readings on Monday - may send vis mychart.    If you need a refill on your cardiac medications before your next appointment, please call your pharmacy.

## 2024-03-10 NOTE — Progress Notes (Signed)
 Clinical Summary Jennifer Anderson is a 68 y.o.female seen today as a new consult, referred by NP Hemberg for the following medical problems.   Previously seen by [facility], last visit 8./2020  1.ASCVD risk stratification - father MI in his 62s, multiple other family members with MIs - coronary calcium score of 53, 50th to 75th percentile.  - Jan 2025 TC 170 TG 40 HDL 75 LDL 87 -ASCVD risk score 12.5%, intermediate risk stress echo from June, 2020 was negative for ischemia, clinically and electrically negative, fx capacity 10 METs, normal EF 60-65%, no significant valvular disease.     2. HTN - taking losartan 50mg  since August - compliant with meds   Past Medical History:  Diagnosis Date   Agatston coronary artery calcium score less than 100 2020   Arthritis    Hormone replacement therapy (HRT)    IBS (irritable bowel syndrome)      No Known Allergies   Current Outpatient Medications  Medication Sig Dispense Refill   aspirin EC 81 MG tablet Take 1 tablet (81 mg total) by mouth daily. Swallow whole.     carboxymethylcellul-glycerin (REFRESH OPTIVE) 0.5-0.9 % ophthalmic solution Place 1 drop into both eyes as needed for dry eyes. (Patient taking differently: Place 1 drop into both eyes 2 (two) times daily.)     losartan (COZAAR) 50 MG tablet Take 50 mg by mouth daily.     pravastatin (PRAVACHOL) 20 MG tablet Take 1 tablet (20 mg total) by mouth every evening. 30 tablet 6   Probiotic Product (PROBIOTIC PO) Take 1 capsule by mouth daily.     No current facility-administered medications for this visit.     Past Surgical History:  Procedure Laterality Date   CESAREAN SECTION  04/08/1985   DILATION AND CURETTAGE OF UTERUS     ROBOTIC ASSISTED TOTAL HYSTERECTOMY WITH BILATERAL SALPINGO OOPHERECTOMY N/A 06/06/2022   Procedure: XI ROBOTIC ASSISTED TOTAL HYSTERECTOMY WITH BILATERAL SALPINGO OOPHORECTOMY;  Surgeon: Viktoria Comer SAUNDERS, MD;  Location: [facility];  Service:  Gynecology;  Laterality: N/A;   TUBAL LIGATION       No Known Allergies    Family History  Problem Relation Age of Onset   Heart disease Mother 47       MI   Heart disease Father 5       second MI in 12s   Diabetes Father    Stroke Paternal Grandfather    Colon cancer Neg Hx    Breast cancer Neg Hx    Ovarian cancer Neg Hx    Endometrial cancer Neg Hx    Pancreatic cancer Neg Hx    Prostate cancer Neg Hx      Social History Ms. Streety reports that she has quit smoking. Her smoking use included cigarettes. She has a 20 pack-year smoking history. She has never used smokeless tobacco. Ms. Nusz reports no history of alcohol use.    Physical Examination Vitals:   03/10/24 0954 03/10/24 1030  BP: (!) 144/82 (!) 148/82  Pulse:    SpO2:     Filed Weights   03/10/24 0948  Weight: 144 lb 9.6 oz (65.6 kg)    Gen: resting comfortably, no acute distress HEENT: no scleral icterus, pupils equal round and reactive, no palptable cervical adenopathy,  CV: RRR, no mrg, no jvd Resp: Clear to auscultation bilaterally GI: abdomen is soft, non-tender, non-distended, normal bowel sounds, no hepatosplenomegaly MSK: extremities are warm, no edema.  Skin: warm, no rash Neuro:  no focal deficits Psych: appropriate affect    Assessment and Plan  1.Cardiac risk assessment - LDL only 87 however her ASCVD risk score is 12.5%, intermediate risk - she had a coronary calcium score  of 53, 50th to 75th percentile. - findings would favor statin to lower her ASCVD risk, will start pravatatin 20mg  daily - start ASA 81mg  daily - EKG NSR, no acute ischemic changes  2. HTN - elevated here, she will update us  on home bp's on Monday. Room to titrate losartan to 100mg  daily if needed      Dorn PHEBE Ross, M.D.

## 2024-03-17 ENCOUNTER — Encounter: Payer: Self-pay | Admitting: Cardiology
